# Patient Record
Sex: Male | Born: 1960 | Race: White | Hispanic: No | Smoking: Never smoker
Health system: Southern US, Community
[De-identification: ages and names within clinical notes are randomized; demographics above are authoritative.]

## PROBLEM LIST (undated history)

## (undated) DIAGNOSIS — E785 Hyperlipidemia, unspecified: Secondary | ICD-10-CM

## (undated) DIAGNOSIS — E119 Type 2 diabetes mellitus without complications: Secondary | ICD-10-CM

## (undated) DIAGNOSIS — N200 Calculus of kidney: Secondary | ICD-10-CM

## (undated) DIAGNOSIS — Z87442 Personal history of urinary calculi: Secondary | ICD-10-CM

## (undated) HISTORY — PX: RETINAL DETACHMENT SURGERY: SHX105

## (undated) HISTORY — DX: Type 2 diabetes mellitus without complications: E11.9

---

## 2007-03-10 ENCOUNTER — Ambulatory Visit: Payer: Self-pay | Admitting: Family Medicine

## 2014-06-20 ENCOUNTER — Encounter: Payer: Self-pay | Admitting: Emergency Medicine

## 2014-06-20 ENCOUNTER — Emergency Department
Admission: EM | Admit: 2014-06-20 | Discharge: 2014-06-20 | Disposition: A | Discharge status: Home / Self Care | Payer: BLUE CROSS/BLUE SHIELD | Attending: Emergency Medicine | Admitting: Emergency Medicine

## 2014-06-20 DIAGNOSIS — R319 Hematuria, unspecified: Secondary | ICD-10-CM

## 2014-06-20 DIAGNOSIS — R7989 Other specified abnormal findings of blood chemistry: Secondary | ICD-10-CM | POA: Diagnosis present

## 2014-06-20 LAB — URINALYSIS COMPLETE WITH MICROSCOPIC (ARMC ONLY)
Bilirubin Urine: NEGATIVE
Glucose, UA: NEGATIVE mg/dL
Ketones, ur: NEGATIVE mg/dL
Leukocytes, UA: NEGATIVE
Nitrite: NEGATIVE
PH: 6 (ref 5.0–8.0)
Protein, ur: 100 mg/dL — AB
SQUAMOUS EPITHELIAL / LPF: NONE SEEN
Specific Gravity, Urine: 1.025 (ref 1.005–1.030)

## 2014-06-20 LAB — CBC WITH DIFFERENTIAL/PLATELET
Basophils Absolute: 0.1 10*3/uL (ref 0–0.1)
Basophils Relative: 1 %
Eosinophils Absolute: 0.4 10*3/uL (ref 0–0.7)
Eosinophils Relative: 4 %
HCT: 44.2 % (ref 40.0–52.0)
Hemoglobin: 15.7 g/dL (ref 13.0–18.0)
LYMPHS ABS: 2.4 10*3/uL (ref 1.0–3.6)
Lymphocytes Relative: 26 %
MCH: 30.9 pg (ref 26.0–34.0)
MCHC: 35.5 g/dL (ref 32.0–36.0)
MCV: 86.9 fL (ref 80.0–100.0)
Monocytes Absolute: 0.6 10*3/uL (ref 0.2–1.0)
Monocytes Relative: 7 %
Neutro Abs: 5.6 10*3/uL (ref 1.4–6.5)
Neutrophils Relative %: 62 %
Platelets: 212 10*3/uL (ref 150–440)
RBC: 5.09 MIL/uL (ref 4.40–5.90)
RDW: 12.5 % (ref 11.5–14.5)
WBC: 9 10*3/uL (ref 3.8–10.6)

## 2014-06-20 LAB — COMPREHENSIVE METABOLIC PANEL
ALT: 52 U/L (ref 17–63)
ANION GAP: 9 (ref 5–15)
AST: 32 U/L (ref 15–41)
Albumin: 4.7 g/dL (ref 3.5–5.0)
Alkaline Phosphatase: 62 U/L (ref 38–126)
BILIRUBIN TOTAL: 0.6 mg/dL (ref 0.3–1.2)
BUN: 19 mg/dL (ref 6–20)
CHLORIDE: 102 mmol/L (ref 101–111)
CO2: 28 mmol/L (ref 22–32)
Calcium: 9.6 mg/dL (ref 8.9–10.3)
Creatinine, Ser: 1.01 mg/dL (ref 0.61–1.24)
GFR calc Af Amer: >60 mL/min (ref >60)
GFR calc non Af Amer: >60 mL/min (ref >60)
Glucose, Bld: 117 mg/dL — ABNORMAL HIGH (ref 65–99)
Potassium: 4.1 mmol/L (ref 3.5–5.1)
Sodium: 139 mmol/L (ref 135–145)
TOTAL PROTEIN: 8 g/dL (ref 6.5–8.1)

## 2014-06-20 NOTE — ED Provider Notes (Signed)
[facility] Emergency Department Provider Note  ____________________________________________  Time seen: 2050  I have reviewed the triage vital signs and the nursing notes.   HISTORY  Chief Complaint Abnormal Lab  dark urine, hematuria.    HPI Bradley Gomez is a 54 y.o. male who has had dark urine. He went to [facility] acute-care for evaluation. They were concerned due to the dark and brown color and he was escorted over to the emergency department for further evaluation. The patient has no pain, he is in no distress. He does report a degree of fullness in his suprapubic area. This discoloration of urine has been occurring for approximately one week. Does have history of kidney stones. This is nothing like his prior stones.    History reviewed. No pertinent past medical history.  There are no active problems to display for this patient.   No past surgical history on file.  No current outpatient prescriptions on file.  Allergies Review of patient's allergies indicates no known allergies.  No family history on file.  Social History History  Substance Use Topics  . Smoking status: Not on file  . Smokeless tobacco: Not on file  . Alcohol Use: Not on file    Review of Systems  Constitutional: Negative for fever. ENT: Negative for sore throat. Cardiovascular: Negative for chest pain. Respiratory: Negative for shortness of breath. Gastrointestinal: Negative for abdominal pain, vomiting and diarrhea. Genitourinary: Dark urine. See history of present illness Musculoskeletal: Negative for back pain. Skin: Negative for rash. Neurological: Negative for headaches   10-point ROS otherwise negative.  ____________________________________________   PHYSICAL EXAM:  VITAL SIGNS: ED Triage Vitals  Enc Vitals Group     BP 06/20/14 1902 141/91 mmHg     Pulse Rate 06/20/14 1902 72     Resp 06/20/14 1902 18     Temp 06/20/14 1902 97.5 F (36.4 C)      Temp Source 06/20/14 1902 Oral     SpO2 06/20/14 1902 100 %     Weight 06/20/14 1902 263 lb (119.296 kg)     Height 06/20/14 1902 6' (1.829 m)     Head Cir --      Peak Flow --      Pain Score 06/20/14 1903 1     Pain Loc --      Pain Edu? --      Excl. in GC? --     Constitutional:  Alert and oriented. Well appearing and in no distress. ENT   Head: Normocephalic and atraumatic.   Nose: No congestion/rhinnorhea.   Mouth/Throat: Mucous membranes are moist.      Ears: Cardiovascular: Normal rate, regular rhythm. Respiratory: Normal respiratory effort without tachypnea. Breath sounds are clear and equal bilaterally. No wheezes/rales/rhonchi. Gastrointestinal: Soft and nontender. No distention.  Back: No muscle spasm, no tenderness, no CVA tenderness. Musculoskeletal: Nontender with normal range of motion in all extremities.  No noted edema. Neurologic:  Normal speech and language. No gross focal neurologic deficits are appreciated.  Skin:  Skin is warm, dry. No rash noted. Psychiatric: Mood and affect are normal. Speech and behavior are normal.  ____________________________________________    LABS (pertinent positives/negatives)  Blood count is normal hemoglobin of 15.7 platelets of 212 Metabolic panel is normal with normal renal function and normal electrolytes. Urinalysis shows red blood cells too numerous to count and white blood cells 6-30. ____________________________________________   INITIAL IMPRESSION / ASSESSMENT AND PLAN / ED COURSE  Well-appearing patient with no pain,  asymptomatic hematuria. With normal blood tests and a urinalysis that does not show sign of infection we will discharge him to follow-up with urology for this hematuria. He does not appear to have any signs of urinary retention.  ____________________________________________   FINAL CLINICAL IMPRESSION(S) / ED DIAGNOSES  Final diagnoses:  Hematuria      Darien Ramusavid W Vestal Markin,  MD 06/20/14 2121

## 2014-06-20 NOTE — Discharge Instructions (Signed)

## 2014-06-20 NOTE — ED Notes (Signed)
States urine has been dark colored x 10 days

## 2014-06-28 ENCOUNTER — Other Ambulatory Visit: Payer: Self-pay | Admitting: Urology

## 2014-06-28 DIAGNOSIS — R310 Gross hematuria: Secondary | ICD-10-CM

## 2014-06-30 ENCOUNTER — Ambulatory Visit: Payer: BLUE CROSS/BLUE SHIELD | Admitting: Anesthesiology

## 2014-06-30 ENCOUNTER — Ambulatory Visit
Admission: AD | Admit: 2014-06-30 | Discharge: 2014-06-30 | Disposition: A | Discharge status: Home / Self Care | Payer: BLUE CROSS/BLUE SHIELD | Source: Ambulatory Visit | Attending: Urology | Admitting: Urology

## 2014-06-30 ENCOUNTER — Encounter: Payer: Self-pay | Admitting: *Deleted

## 2014-06-30 ENCOUNTER — Encounter
Admission: AD | Disposition: A | Discharge status: Home / Self Care | Payer: Self-pay | Source: Ambulatory Visit | Attending: Urology

## 2014-06-30 DIAGNOSIS — N201 Calculus of ureter: Secondary | ICD-10-CM | POA: Diagnosis present

## 2014-06-30 DIAGNOSIS — N23 Unspecified renal colic: Secondary | ICD-10-CM | POA: Diagnosis present

## 2014-06-30 DIAGNOSIS — Z8489 Family history of other specified conditions: Secondary | ICD-10-CM | POA: Insufficient documentation

## 2014-06-30 DIAGNOSIS — N132 Hydronephrosis with renal and ureteral calculous obstruction: Secondary | ICD-10-CM | POA: Diagnosis not present

## 2014-06-30 DIAGNOSIS — A419 Sepsis, unspecified organism: Secondary | ICD-10-CM | POA: Diagnosis not present

## 2014-06-30 DIAGNOSIS — Z801 Family history of malignant neoplasm of trachea, bronchus and lung: Secondary | ICD-10-CM | POA: Diagnosis not present

## 2014-06-30 DIAGNOSIS — Z833 Family history of diabetes mellitus: Secondary | ICD-10-CM | POA: Diagnosis not present

## 2014-06-30 HISTORY — PX: CYSTOSCOPY WITH STENT PLACEMENT: SHX5790

## 2014-06-30 SURGERY — CYSTOSCOPY, WITH STENT INSERTION
Anesthesia: General | Laterality: Left | Wound class: Clean Contaminated

## 2014-06-30 MED ORDER — FAMOTIDINE 20 MG PO TABS
20.0000 mg | ORAL_TABLET | Freq: Once | ORAL | Status: AC
  Administered 2014-06-30: 20 mg via ORAL

## 2014-06-30 MED ORDER — FENTANYL CITRATE (PF) 100 MCG/2ML IJ SOLN
25.0000 ug | INTRAMUSCULAR | Status: DC | PRN
  Administered 2014-06-30 (×4): 25 ug via INTRAVENOUS

## 2014-06-30 MED ORDER — LEVOFLOXACIN IN D5W 500 MG/100ML IV SOLN
500.0000 mg | Freq: Once | INTRAVENOUS | Status: AC
  Administered 2014-06-30: 500 mg via INTRAVENOUS

## 2014-06-30 MED ORDER — LABETALOL HCL 5 MG/ML IV SOLN
5.0000 mg | INTRAVENOUS | Status: AC | PRN
  Administered 2014-06-30 (×3): 5 mg via INTRAVENOUS

## 2014-06-30 MED ORDER — LEVOFLOXACIN 500 MG PO TABS: 500.0000 mg | ORAL_TABLET | Freq: Every day | ORAL | Status: DC

## 2014-06-30 MED ORDER — MIDAZOLAM HCL 2 MG/2ML IJ SOLN
INTRAMUSCULAR | Status: DC | PRN
  Administered 2014-06-30: 2 mg via INTRAVENOUS

## 2014-06-30 MED ORDER — LACTATED RINGERS IV SOLN
INTRAVENOUS | Status: DC | PRN
  Administered 2014-06-30: 16:00:00 via INTRAVENOUS

## 2014-06-30 MED ORDER — LEVOFLOXACIN IN D5W 500 MG/100ML IV SOLN
INTRAVENOUS | Status: DC
Start: 2014-06-30 — End: 2014-06-30
  Filled 2014-06-30: qty 100

## 2014-06-30 MED ORDER — LIDOCAINE HCL (CARDIAC) 20 MG/ML IV SOLN
INTRAVENOUS | Status: DC | PRN
  Administered 2014-06-30: 50 mg via INTRAVENOUS

## 2014-06-30 MED ORDER — LIDOCAINE HCL 2 % IJ SOLN: 0.1000 mL | Freq: Once | INTRAMUSCULAR | Status: DC

## 2014-06-30 MED ORDER — FENTANYL CITRATE (PF) 100 MCG/2ML IJ SOLN
INTRAMUSCULAR | Status: AC
  Administered 2014-06-30: 25 ug via INTRAVENOUS
  Filled 2014-06-30: qty 2

## 2014-06-30 MED ORDER — HYDROMORPHONE HCL 1 MG/ML IJ SOLN: 0.1000 mg | INTRAMUSCULAR | Status: DC | PRN

## 2014-06-30 MED ORDER — URIBEL 118 MG PO CAPS: 1.0000 | ORAL_CAPSULE | Freq: Four times a day (QID) | ORAL | Status: DC | PRN

## 2014-06-30 MED ORDER — FENTANYL CITRATE (PF) 100 MCG/2ML IJ SOLN
INTRAMUSCULAR | Status: DC | PRN
  Administered 2014-06-30 (×3): 50 ug via INTRAVENOUS

## 2014-06-30 MED ORDER — PROPOFOL 10 MG/ML IV BOLUS
INTRAVENOUS | Status: DC | PRN
  Administered 2014-06-30: 200 mg via INTRAVENOUS

## 2014-06-30 MED ORDER — LABETALOL HCL 5 MG/ML IV SOLN
INTRAVENOUS | Status: AC
  Administered 2014-06-30: 5 mg via INTRAVENOUS
  Filled 2014-06-30: qty 4

## 2014-06-30 MED ORDER — PROMETHAZINE HCL 25 MG RE SUPP: 25.0000 mg | Freq: Four times a day (QID) | RECTAL | Status: DC | PRN

## 2014-06-30 MED ORDER — FAMOTIDINE 20 MG PO TABS
ORAL_TABLET | ORAL | Status: AC
  Administered 2014-06-30: 20 mg via ORAL
  Filled 2014-06-30: qty 1

## 2014-06-30 MED ORDER — ONDANSETRON 8 MG PO TBDP: 8.0000 mg | ORAL_TABLET | Freq: Four times a day (QID) | ORAL | Status: DC | PRN

## 2014-06-30 MED ORDER — BELLADONNA ALKALOIDS-OPIUM 16.2-30 MG RE SUPP
RECTAL | Status: DC | PRN
  Administered 2014-06-30: 15 mg via RECTAL

## 2014-06-30 MED ORDER — ONDANSETRON HCL 4 MG/2ML IJ SOLN: 4.0000 mg | Freq: Once | INTRAMUSCULAR | Status: DC | PRN

## 2014-06-30 MED ORDER — DEXTROSE-NACL 5-0.45 % IV SOLN
INTRAVENOUS | Status: DC
  Administered 2014-06-30: 16:00:00 via INTRAVENOUS

## 2014-06-30 MED ORDER — LIDOCAINE HCL 2 % EX GEL
CUTANEOUS | Status: DC | PRN
  Administered 2014-06-30: 1

## 2014-06-30 MED ORDER — LIDOCAINE HCL 2 % EX GEL
CUTANEOUS | Status: AC
  Filled 2014-06-30: qty 10

## 2014-06-30 MED ORDER — ONDANSETRON 4 MG PO TBDP: 8.0000 mg | ORAL_TABLET | Freq: Four times a day (QID) | ORAL | Status: DC | PRN

## 2014-06-30 MED ORDER — DOCUSATE SODIUM 100 MG PO CAPS: 200.0000 mg | ORAL_CAPSULE | Freq: Two times a day (BID) | ORAL | Status: DC

## 2014-06-30 MED ORDER — ONDANSETRON HCL 4 MG/2ML IJ SOLN
INTRAMUSCULAR | Status: DC | PRN
  Administered 2014-06-30: 4 mg via INTRAVENOUS

## 2014-06-30 MED ORDER — NUCYNTA 50 MG PO TABS: 50.0000 mg | ORAL_TABLET | Freq: Four times a day (QID) | ORAL | Status: DC | PRN

## 2014-06-30 MED ORDER — BELLADONNA ALKALOIDS-OPIUM 16.2-60 MG RE SUPP
RECTAL | Status: AC
  Filled 2014-06-30: qty 1

## 2014-06-30 SURGICAL SUPPLY — 22 items
BAG DRAIN CYSTO-URO LG1000N (MISCELLANEOUS) ×2 IMPLANT
CONRAY 43 FOR UROLOGY 50M (MISCELLANEOUS) ×2 IMPLANT
GLOVE BIO SURGEON STRL SZ7 (GLOVE) ×4 IMPLANT
GLOVE BIO SURGEON STRL SZ7.5 (GLOVE) ×2 IMPLANT
GOWN STRL REUS W/ TWL LRG LVL3 (GOWN DISPOSABLE) ×1 IMPLANT
GOWN STRL REUS W/ TWL XL LVL3 (GOWN DISPOSABLE) ×1 IMPLANT
GOWN STRL REUS W/TWL LRG LVL3 (GOWN DISPOSABLE) ×1
GOWN STRL REUS W/TWL XL LVL3 (GOWN DISPOSABLE) ×1
GUIDEWIRE STR ZIPWIRE 035X150 (MISCELLANEOUS) ×2 IMPLANT
KIT RM TURNOVER CYSTO AR (KITS) ×2 IMPLANT
OPEN-END URETERAL CATHETER ×2 IMPLANT
PACK CYSTO AR (MISCELLANEOUS) ×2 IMPLANT
PREP PVP WINGED SPONGE (MISCELLANEOUS) ×2 IMPLANT
SET CYSTO W/LG BORE CLAMP LF (SET/KITS/TRAYS/PACK) ×2 IMPLANT
SOL .9 NS 3000ML IRR  AL (IV SOLUTION) ×1
SOL .9 NS 3000ML IRR UROMATIC (IV SOLUTION) ×1 IMPLANT
SOL PREP PVP 2OZ (MISCELLANEOUS) ×2
SOLUTION PREP PVP 2OZ (MISCELLANEOUS) ×1 IMPLANT
STENT URET 6FRX24 CONTOUR (STENTS) IMPLANT
STENT URET 6FRX26 CONTOUR (STENTS) ×2 IMPLANT
STENT URET 6FRX26 FIRM (Stent) ×4 IMPLANT
WATER STERILE IRR 1000ML POUR (IV SOLUTION) ×2 IMPLANT

## 2014-06-30 NOTE — Op Note (Signed)
Op Note  Preoperative diagnosis: Left ureterolithiasis. Left hydronephrosis with urinary sepsis Postoperative diagnosis: Same  Procedure: 1. Cystoscopy with left double pigtail stent placement 2. Left retrograde pyelogram   Surgeon: Suszanne ConnersMichael R. Evelene CroonWolff MD, FACS Anesthesia: Gen.  Indications:See the history and physical. After informed consent the above procedure(s) were requested     Technique and findings: After adequate general anesthesia had been obtained the patient was placed into dorsal lithotomy position. The perineum was prepped and draped in the usual fashion. Fluoroscopy confirmed the presence of a proximal left ureteral calculus measuring 10 x 5 mm. At this point an 8 [place] open ended ureteral catheter was advanced through the scope and placed into the left ureteral orifice. Retrograde pyelogram was performed. Fluoroscopic and static images confirmed the presence of a proximal left ureteral calculus with proximal hydronephrosis. No other abnormalities or filling defects were identified. At this point a 0.035 Glidewire was advanced into the left ureteral orifice and up the ureter beyond the stone. The guidewire was then curled into the renal pelvis. A 6 x 26 cm double pigtail stent with suture attached distally was advanced over the guidewire. The guidewire was then removed taking care to leave the stent in position. Placement of the stent was confirmed with for fluoroscopy. The stone had been pushed back into the renal pelvis. At this point the bladder was drained and the cystoscope was removed. 10 cc of viscous Xylocaine was instilled within the urethra and the bladder. A B and O suppository was placed. The procedure was then terminated and the patient was transferred to the recovery room in stable condition.

## 2014-06-30 NOTE — Anesthesia Procedure Notes (Signed)
Procedure Name: LMA Insertion Date/Time: 06/30/2014 4:43 PM Performed by: Tonia GhentOOK-MARTIN, Bobbijo Holst Pre-anesthesia Checklist: Patient identified, Emergency Drugs available, Suction available, Patient being monitored and Timeout performed Patient Re-evaluated:Patient Re-evaluated prior to inductionOxygen Delivery Method: Circle system utilized and Simple face mask Preoxygenation: Pre-oxygenation with 100% oxygen Intubation Type: IV induction Ventilation: Mask ventilation without difficulty LMA Size: 4.0 Number of attempts: 1 Placement Confirmation: positive ETCO2 and breath sounds checked- equal and bilateral Tube secured with: Tape

## 2014-06-30 NOTE — Discharge Instructions (Signed)
Ureteral Stent Implantation Ureteral stent implantation is the implantation of a soft plastic tube with multiple holes into the tube that drains urine from your kidney to your bladder (ureter). The stent helps drain your kidney when there is a blockage of the flow of urine in your ureter. The stent has a coil on each end to keep it from falling out. One end stays in the kidney. The other end stays in the bladder. It is most often taken out after any blockage has been removed or your ureter has healed. Short-term stents have a string attached to make removal quite easy. Removal of a short-term stent can be done in your health care provider's office or by you at home. Long-term stents need to be changed every few months. LET [facility]OUR [facility]ALTH CARE PROVIDER KNOW ABOUT: Any allergies you have. All medicines you are taking, including vitamins, herbs, eye drops, creams, and over-the-counter medicines. Previous problems you or members of your family have had with the use of anesthetics. Any blood disorders you have. Previous surgeries you have had. Medical conditions you have. RISKS AND COMPLICATIONS Generally, ureteral stent implantation is a safe procedure. However, as with any procedure, complications can occur. Possible complications include: Movement of the stent away from where it was originally placed (migration). This may affect the ability of the stent to properly drain your kidney. If migration of the stent occurs, the stent may need to be replaced or repositioned. Perforation of the ureter. Infection. BEFORE THE PROCEDURE You may be asked to wash your genital area with sterile soap the morning of your procedure. You may be given an oral antibiotic which you should take with a sip of water as prescribed by your health care provider. You may be asked to not eat or drink for 8 hours before the surgery. PROCEDURE First you will be given an anesthetic so you do not feel pain during the procedure. Your  health care provider will insert a special lighted instrument called a cystoscope into your bladder. This allows your health care provider to see the opening to your ureter. A thin wire is carefully threaded into your bladder and up the ureter. The stent is inserted over the wire and the wire is then removed. Your bladder will be emptied of urine. AFTER THE PROCEDURE You will be taken to a recovery room until it is okay for you to go home. Document Released: 01/12/2000 Document Revised: 01/19/2013 Document Reviewed: 06/23/2012 [facility] Patient Information 2015 [place], [place]. This information is not intended to replace advice given to you by your health care provider. Make sure you discuss any questions you have with your health care provider. Ureteral Colic (Kidney Stones) Ureteral colic is the result of a condition when kidney stones form inside the kidney. Once kidney stones are formed they may move into the tube that connects the kidney with the bladder (ureter). If this occurs, this condition may cause pain (colic) in the ureter.  CAUSES  Pain is caused by stone movement in the ureter and the obstruction caused by the stone. SYMPTOMS  The pain comes and goes as the ureter contracts around the stone. The pain is usually intense, sharp, and stabbing in character. The location of the pain may move as the stone moves through the ureter. When the stone is near the kidney the pain is usually located in the back and radiates to the belly (abdomen). When the stone is ready to pass into the bladder the pain is often located in the  lower abdomen on the side the stone is located. At this location, the symptoms may mimic those of a urinary tract infection with urinary frequency. Once the stone is located here it often passes into the bladder and the pain disappears completely. TREATMENT   Your caregiver will provide you with medicine for pain relief.  You may require specialized follow-up X-rays.  The  absence of pain does not always mean that the stone has passed. It may have just stopped moving. If the urine remains completely obstructed, it can cause loss of kidney function or even complete destruction of the involved kidney. It is your responsibility and in your interest that X-rays and follow-ups as suggested by your caregiver are completed. Relief of pain without passage of the stone can be associated with severe damage to the kidney, including loss of kidney function on that side.  If your stone does not pass on its own, additional measures may be taken by your caregiver to ensure its removal. HOME CARE INSTRUCTIONS   Increase your fluid intake. Water is the preferred fluid since juices containing vitamin C may acidify the urine making it less likely for certain stones (uric acid stones) to pass.  Strain all urine. A strainer will be provided. Keep all particulate matter or stones for your caregiver to inspect.  Take your pain medicine as directed.  Make a follow-up appointment with your caregiver as directed.  Remember that the goal is passage of your stone. The absence of pain does not mean the stone is gone. Follow your caregiver's instructions.  Only take over-the-counter or prescription medicines for pain, discomfort, or fever as directed by your caregiver. SEEK MEDICAL CARE IF:   Pain cannot be controlled with the prescribed medicine.  You have a fever.  Pain continues for longer than your caregiver advises it should.  There is a change in the pain, and you develop chest discomfort or constant abdominal pain.  You feel faint or pass out. MAKE SURE YOU:   Understand these instructions.  Will watch your condition.  Will get help right away if you are not doing well or get worse. Document Released: 10/24/2004 Document Revised: 05/11/2012 Document Reviewed: 07/11/2010 [facility] Patient Information 2015 [place], [place]. This information is not intended to replace advice  given to you by your health care provider. Make sure you discuss any questions you have with your health care provider.    AMBULATORY SURGERY  DISCHARGE INSTRUCTIONS   1) The drugs that you were given will stay in your system until tomorrow so for the next 24 hours you should not:  A) Drive an automobile B) Make any legal decisions C) Drink any alcoholic beverage   2) You may resume regular meals tomorrow.  Today it is better to start with liquids and gradually work up to solid foods.  You may eat anything you prefer, but it is better to start with liquids, then soup and crackers, and gradually work up to solid foods.   3) Please notify your doctor immediately if you have any unusual bleeding, trouble breathing, redness and pain at the surgery site, drainage, fever, or pain not relieved by medications                             4)

## 2014-06-30 NOTE — Anesthesia Postprocedure Evaluation (Signed)
  Anesthesia Post-op Note  Patient: Bradley GaviaDarrell D Gomez  Procedure(s) Performed: Procedure(s): CYSTOSCOPY WITH STENT PLACEMENT (Left)  Anesthesia type:General LMA  Patient location: PACU  Post pain: Pain level controlled  Post assessment: Post-op Vital signs reviewed, Patient's Cardiovascular Status Stable, Respiratory Function Stable, Patent Airway and No signs of Nausea or vomiting  Post vital signs: Reviewed and stable  Last Vitals:  Filed Vitals:   06/30/14 1827  BP: 126/82  Pulse: 63  Temp: 36.6 C  Resp: 16    Level of consciousness: awake, alert  and patient cooperative  Complications: No apparent anesthesia complications

## 2014-06-30 NOTE — H&P (Signed)
  Date of Initial H&P: 06/30/2010   History reviewed, patient examined, no change in status, stable for surgery.

## 2014-06-30 NOTE — Transfer of Care (Signed)
Immediate Anesthesia Transfer of Care Note  Patient: Eugene GaviaDarrell D Foulks  Procedure(s) Performed: Procedure(s): CYSTOSCOPY WITH STENT PLACEMENT (Left)  Patient Location: PACU  Anesthesia Type:General  Level of Consciousness: awake and sedated  Airway & Oxygen Therapy: Patient Spontanous Breathing and Patient connected to face mask oxygen  Post-op Assessment: Report given to RN and Post -op Vital signs reviewed and stable  Post vital signs: Reviewed and stable  Last Vitals:  Filed Vitals:   06/30/14 1538  BP: 136/91  Pulse: 60  Temp: 36.8 C  Resp: 18    Complications: No apparent anesthesia complications

## 2014-06-30 NOTE — Anesthesia Preprocedure Evaluation (Signed)
Anesthesia Evaluation  Patient identified by MRN, date of birth, ID band Patient awake    Reviewed: Allergy & Precautions, H&P , NPO status , Patient's Chart, lab work & pertinent test results, reviewed documented beta blocker date and time   Airway Mallampati: II  TM Distance: >3 FB Neck ROM: full    Dental   Pulmonary Current Smoker,          Cardiovascular Rate:Normal     Neuro/Psych    GI/Hepatic   Endo/Other    Renal/GU      Musculoskeletal   Abdominal   Peds  Hematology   Anesthesia Other Findings   Reproductive/Obstetrics                             Anesthesia Physical Anesthesia Plan  ASA: II  Anesthesia Plan: General LMA   Post-op Pain Management:    Induction:   Airway Management Planned:   Additional Equipment:   Intra-op Plan:   Post-operative Plan:   Informed Consent: I have reviewed the patients History and Physical, chart, labs and discussed the procedure including the risks, benefits and alternatives for the proposed anesthesia with the patient or authorized representative who has indicated his/her understanding and acceptance.     Plan Discussed with: CRNA  Anesthesia Plan Comments:         Anesthesia Quick Evaluation  

## 2014-07-01 ENCOUNTER — Encounter: Payer: Self-pay | Admitting: Urology

## 2014-07-04 ENCOUNTER — Encounter: Payer: Self-pay | Admitting: Urology

## 2014-07-06 ENCOUNTER — Encounter
Admission: RE | Admit: 2014-07-06 | Discharge: 2014-07-06 | Disposition: A | Discharge status: Home / Self Care | Payer: BLUE CROSS/BLUE SHIELD | Source: Ambulatory Visit | Attending: Urology | Admitting: Urology

## 2014-07-07 ENCOUNTER — Encounter: Payer: Self-pay | Admitting: *Deleted

## 2014-07-07 ENCOUNTER — Ambulatory Visit
Admission: RE | Admit: 2014-07-07 | Discharge: 2014-07-07 | Disposition: A | Discharge status: Home / Self Care | Payer: BLUE CROSS/BLUE SHIELD | Source: Ambulatory Visit | Attending: Urology | Admitting: Urology

## 2014-07-07 ENCOUNTER — Encounter
Admission: RE | Disposition: A | Discharge status: Home / Self Care | Payer: Self-pay | Source: Ambulatory Visit | Attending: Urology

## 2014-07-07 DIAGNOSIS — Z833 Family history of diabetes mellitus: Secondary | ICD-10-CM | POA: Diagnosis not present

## 2014-07-07 DIAGNOSIS — N201 Calculus of ureter: Secondary | ICD-10-CM | POA: Diagnosis not present

## 2014-07-07 DIAGNOSIS — Z8489 Family history of other specified conditions: Secondary | ICD-10-CM | POA: Insufficient documentation

## 2014-07-07 DIAGNOSIS — Z79899 Other long term (current) drug therapy: Secondary | ICD-10-CM | POA: Insufficient documentation

## 2014-07-07 HISTORY — PX: EXTRACORPOREAL SHOCK WAVE LITHOTRIPSY: SHX1557

## 2014-07-07 SURGERY — LITHOTRIPSY, ESWL
Anesthesia: Moderate Sedation | Laterality: Left

## 2014-07-07 MED ORDER — ONDANSETRON 8 MG PO TBDP: 8.0000 mg | ORAL_TABLET | Freq: Four times a day (QID) | ORAL | Status: DC | PRN

## 2014-07-07 MED ORDER — LEVOFLOXACIN 500 MG PO TABS: 500.0000 mg | ORAL_TABLET | Freq: Every day | ORAL | Status: DC

## 2014-07-07 MED ORDER — TAMSULOSIN HCL 0.4 MG PO CAPS: 0.4000 mg | ORAL_CAPSULE | Freq: Every day | ORAL | Status: DC

## 2014-07-07 MED ORDER — PROMETHAZINE HCL 25 MG/ML IJ SOLN
25.0000 mg | Freq: Once | INTRAMUSCULAR | Status: AC
Start: 2014-07-07 — End: 2014-07-07
  Administered 2014-07-07: 25 mg via INTRAMUSCULAR

## 2014-07-07 MED ORDER — DEXTROSE-NACL 5-0.45 % IV SOLN
INTRAVENOUS | Status: DC
  Administered 2014-07-07: 13:00:00 via INTRAVENOUS

## 2014-07-07 MED ORDER — MIDAZOLAM HCL 2 MG/2ML IJ SOLN
1.0000 mg | Freq: Once | INTRAMUSCULAR | Status: AC
  Administered 2014-07-07: 1 mg via INTRAMUSCULAR

## 2014-07-07 MED ORDER — PROMETHAZINE HCL 25 MG/ML IJ SOLN
INTRAMUSCULAR | Status: AC
  Administered 2014-07-07: 25 mg via INTRAMUSCULAR
  Filled 2014-07-07: qty 1

## 2014-07-07 MED ORDER — MORPHINE SULFATE 10 MG/ML IJ SOLN
10.0000 mg | Freq: Once | INTRAMUSCULAR | Status: AC
Start: 2014-07-07 — End: 2014-07-07
  Administered 2014-07-07: 10 mg via INTRAMUSCULAR

## 2014-07-07 MED ORDER — MIDAZOLAM HCL 2 MG/2ML IJ SOLN
INTRAMUSCULAR | Status: AC
  Administered 2014-07-07: 1 mg via INTRAMUSCULAR
  Filled 2014-07-07: qty 2

## 2014-07-07 MED ORDER — DIPHENHYDRAMINE HCL 25 MG PO CAPS
25.0000 mg | ORAL_CAPSULE | ORAL | Status: AC
  Administered 2014-07-07: 25 mg via ORAL

## 2014-07-07 MED ORDER — DOCUSATE SODIUM 100 MG PO CAPS: 200.0000 mg | ORAL_CAPSULE | Freq: Two times a day (BID) | ORAL | Status: DC

## 2014-07-07 MED ORDER — MORPHINE SULFATE 10 MG/ML IJ SOLN
INTRAMUSCULAR | Status: AC
  Administered 2014-07-07: 10 mg via INTRAMUSCULAR
  Filled 2014-07-07: qty 1

## 2014-07-07 MED ORDER — DIPHENHYDRAMINE HCL 25 MG PO CAPS
ORAL_CAPSULE | ORAL | Status: AC
  Administered 2014-07-07: 25 mg via ORAL
  Filled 2014-07-07: qty 1

## 2014-07-07 MED ORDER — NUCYNTA 50 MG PO TABS: 50.0000 mg | ORAL_TABLET | Freq: Four times a day (QID) | ORAL | Status: DC | PRN

## 2014-07-07 MED ORDER — URIBEL 118 MG PO CAPS: 1.0000 | ORAL_CAPSULE | Freq: Four times a day (QID) | ORAL | Status: DC | PRN

## 2014-07-07 MED ORDER — LEVOFLOXACIN 500 MG PO TABS: 500.0000 mg | ORAL_TABLET | Freq: Once | ORAL | Status: DC

## 2014-07-07 NOTE — Discharge Instructions (Signed)
Lithotripsy for Kidney Stones °Lithotripsy is a treatment that can sometimes help eliminate kidney stones and pain that they cause. A form of lithotripsy, also known as extracorporeal shock wave lithotripsy, is a nonsurgical procedure that helps your body rid itself of the kidney stone when it is too big to pass on its own. Extracorporeal shock wave lithotripsy is a method of crushing a kidney stone with shock waves. These shock waves pass through your body and are focused on your stone. They cause the kidney stones to crumble while still in the urinary tract. It is then easier for the smaller pieces of stone to pass in the urine. °Lithotripsy usually takes about an hour. It is done in a hospital, a lithotripsy center, or a mobile unit. It usually does not require an overnight stay. Your health care provider will instruct you on preparation for the procedure. Your health care provider will tell you what to expect afterward. °LET YOUR HEALTH CARE PROVIDER KNOW ABOUT: °· Any allergies you have. °· All medicines you are taking, including vitamins, herbs, eye drops, creams, and over-the-counter medicines. °· Previous problems you or members of your family have had with the use of anesthetics. °· Any blood disorders you have. °· Previous surgeries you have had. °· Medical conditions you have. °RISKS AND COMPLICATIONS °Generally, lithotripsy for kidney stones is a safe procedure. However, as with any procedure, complications can occur. Possible complications include: °· Infection. °· Bleeding of the kidney. °· Bruising of the kidney or skin. °· Obstruction of the ureter. °· Failure of the stone to fragment. °BEFORE THE PROCEDURE °· Do not eat or drink for 6-8 hours prior to the procedure. You may, however, take the medications with a sip of water that your physician instructs you to take °· Do not take aspirin or aspirin-containing products for 7 days prior to your procedure °· Do not take nonsteroidal anti-inflammatory  products for 7 days prior to your procedure °PROCEDURE °A stent (flexible tube with holes) may be placed in your ureter. The ureter is the tube that transports the urine from the kidneys to the bladder. Your health care provider may place a stent before the procedure. This will help keep urine flowing from the kidney if the fragments of the stone block the ureter. You may have an IV tube placed in one of your veins to give you fluids and medicines. These medicines may help you relax or make you sleep. During the procedure, you will lie comfortably on a fluid-filled cushion or in a warm-water bath. After an X-ray or ultrasound exam to locate your stone, shock waves are aimed at the stone. If you are awake, you may feel a tapping sensation as the shock waves pass through your body. If large stone particles remain after treatment, a second procedure may be necessary at a later date. °For comfort during the test: °· Relax as much as possible. °· Try to remain still as much as possible. °· Try to follow instructions to speed up the test. °· Let your health care provider know if you are uncomfortable, anxious, or in pain. °AFTER THE PROCEDURE  °After surgery, you will be taken to the recovery area. A nurse will watch and check your progress. Once you're awake, stable, and taking fluids well, you will be allowed to go home as long as there are no problems. You will also be allowed to pass your urine before discharge. You may be given antibiotics to help prevent infection. You may also be prescribed   pain medicine if needed. In a week or two, your health care provider may remove your stent, if you have one. You may first have an X-ray exam to check on how successful the fragmentation of your stone has been and how much of the stone has passed. Your health care provider will check to see whether or not stone particles remain. °SEEK IMMEDIATE MEDICAL CARE IF: °· You develop a fever or shaking chills. °· Your pain is not  relieved by medicine. °· You feel sick to your stomach (nauseated) and you vomit. °· You develop heavy bleeding. °· You have difficulty urinating. °· You start to pass your stent from your penis. °Document Released: 01/12/2000 Document Revised: 11/04/2012 Document Reviewed: 07/30/2012 °ExitCare® Patient Information ©2015 [employer], [employer]. This information is not intended to replace advice given to you by your health care provider. Make sure you discuss any questions you have with your health care provider. ° °

## 2014-07-07 NOTE — H&P (Signed)
Date of Initial H&P: 06/29/14  History reviewed, patient examined, no change in status, stable for surgery. 

## 2014-07-08 ENCOUNTER — Ambulatory Visit: Payer: BLUE CROSS/BLUE SHIELD

## 2014-10-07 ENCOUNTER — Encounter: Payer: Self-pay | Admitting: Urology

## 2016-06-25 ENCOUNTER — Encounter: Payer: Self-pay | Admitting: *Deleted

## 2016-06-25 ENCOUNTER — Encounter: Payer: BLUE CROSS/BLUE SHIELD | Attending: Family Medicine | Admitting: *Deleted

## 2016-06-25 VITALS — BP 136/84 | Ht 72.0 in | Wt 245.9 lb

## 2016-06-25 DIAGNOSIS — E119 Type 2 diabetes mellitus without complications: Secondary | ICD-10-CM

## 2016-06-25 DIAGNOSIS — Z6833 Body mass index (BMI) 33.0-33.9, adult: Secondary | ICD-10-CM | POA: Insufficient documentation

## 2016-06-25 DIAGNOSIS — Z713 Dietary counseling and surveillance: Secondary | ICD-10-CM | POA: Diagnosis not present

## 2016-06-25 NOTE — Patient Instructions (Signed)
Check blood sugars 2 x day before breakfast and 2 hrs after supper every day  Bring blood sugar records to the next class  Exercise: Begin riding bike  for 10  minutes 3  days a week and gradually increase to 30 minutes 5 x week  Eat 3 meals day,  1-2 snacks a day Space meals 4-6 hours apart  Avoid sugar sweetened drinks (sports drinks)  Make an eye doctor appointment  Return for classes on:

## 2016-06-25 NOTE — Progress Notes (Signed)
Diabetes Self-Management Education  Visit Type: First/Initial  Appt. Start Time: 1545 Appt. End Time: 1700  06/25/2016  Bradley Gomez, identified by name and date of birth, is a 56 y.o. male with a diagnosis of Diabetes: Type 2.   ASSESSMENT  Blood pressure 136/84, height 6' (1.829 m), weight 245 lb 14.4 oz (111.5 kg). Body mass index is 33.35 kg/m.      Diabetes Self-Management Education - 06/25/16 1718      Visit Information   Visit Type First/Initial     Initial Visit   Diabetes Type Type 2   Are you currently following a meal plan? Yes   What type of meal plan do you follow? "eating food with less sugar"   Are you taking your medications as prescribed? Yes   Date Diagnosed 1 month ago     Health Coping   How would you rate your overall health? Fair     Psychosocial Assessment   Patient Belief/Attitude about Diabetes Motivated to manage diabetes  "wished I didn't have it"   Self-care barriers None   Self-management support Doctor's office;Friends   Other persons present Other (comment)  fiancee   Patient Concerns Nutrition/Meal planning;Glycemic Control;Monitoring;Weight Control;Healthy Lifestyle   Special Needs None   Preferred Learning Style Auditory;Hands on   Learning Readiness Change in progress   How often do you need to have someone help you when you read instructions, pamphlets, or other written materials from your doctor or pharmacy? 1 - Never   What is the last grade level you completed in school? 12     Pre-Education Assessment   Patient understands the diabetes disease and treatment process. Needs Instruction   Patient understands incorporating nutritional management into lifestyle. Needs Instruction   Patient undertands incorporating physical activity into lifestyle. Needs Instruction   Patient understands using medications safely. Needs Instruction   Patient understands monitoring blood glucose, interpreting and using results Needs Review    Patient understands prevention, detection, and treatment of acute complications. Needs Instruction   Patient understands prevention, detection, and treatment of chronic complications. Needs Instruction   Patient understands how to develop strategies to address psychosocial issues. Needs Instruction   Patient understands how to develop strategies to promote health/change behavior. Needs Instruction     Complications   Last HgB A1C per patient/outside source 11 %  05/09/16   How often do you check your blood sugar? 1-2 times/day   Postprandial Blood glucose range (mg/dL) 132-440;102-725130-179;180-200  Pt reports pp readings of 150-180's mg/dL.    Have you had a dilated eye exam in the past 12 months? No   Have you had a dental exam in the past 12 months? No   Are you checking your feet? Yes   How many days per week are you checking your feet? 7     Dietary Intake   Breakfast peanut butter crackers, apple, cheese its   Lunch 1/2 sandwich with peanut butter, hame, chicken salad or pimento cheese and fruit   Dinner meat - chicken, beef, pork, fish with pinto beans, green beans, peas, corn, salad - occasional spaghetti, pizza, fries   Beverage(s) water, G2     Exercise   Exercise Type ADL's     Patient Education   Previous Diabetes Education No   Disease state  Definition of diabetes, type 1 and 2, and the diagnosis of diabetes   Nutrition management  Role of diet in the treatment of diabetes and the relationship between the three main  macronutrients and blood glucose level;Carbohydrate counting;Reviewed blood glucose goals for pre and post meals and how to evaluate the patients' food intake on their blood glucose level.   Physical activity and exercise  Role of exercise on diabetes management, blood pressure control and cardiac health.   Medications Reviewed patients medication for diabetes, action, purpose, timing of dose and side effects.;Other (comment)  Cortisone injections and elevated blood sugars     Monitoring Purpose and frequency of SMBG.;Taught/discussed recording of test results and interpretation of SMBG.;Identified appropriate SMBG and/or A1C goals.   Chronic complications Relationship between chronic complications and blood glucose control;Retinopathy and reason for yearly dilated eye exams   Psychosocial adjustment Identified and addressed patients feelings and concerns about diabetes     Individualized Goals (developed by patient)   Reducing Risk Improve blood sugars Prevent diabetes complications Lose weight Lead a healthier lifestyle Become more fit     Outcomes   Expected Outcomes Demonstrated interest in learning. Expect positive outcomes      Individualized Plan for Diabetes Self-Management Training:   Learning Objective:  Patient will have a greater understanding of diabetes self-management. Patient education plan is to attend individual and/or group sessions per assessed needs and concerns.   Plan:   Patient Instructions  Check blood sugars 2 x day before breakfast and 2 hrs after supper every day Bring blood sugar records to the next class Exercise: Begin riding bike  for 10  minutes 3  days a week and gradually increase to 30 minutes 5 x week Eat 3 meals day,  1-2 snacks a day Space meals 4-6 hours apart Avoid sugar sweetened drinks (sports drinks) Make an eye doctor appointment   Expected Outcomes:  Demonstrated interest in learning. Expect positive outcomes  Education material provided:  General Meal Planning Guidelines Simple Meal Plan  If problems or questions, patient to contact team via:  Sharion Settler, RN, CCM, CDE [phone]  Future DSME appointment:  Pt to check his fiancee's work calendar and call back to schedule classes.

## 2016-07-02 ENCOUNTER — Telehealth: Payer: Self-pay | Admitting: *Deleted

## 2016-07-02 NOTE — Telephone Encounter (Signed)
Received voice mail from patient's fiancee that he wants to come to class beginning June 25th but doesn't know where classes are held. Called her back and [facility]. She also reports that he was started on Glipizide and his blood sugars are better - 127 last night after supper and 79 this morning fasting.

## 2016-07-22 ENCOUNTER — Encounter: Payer: BLUE CROSS/BLUE SHIELD | Attending: Family Medicine | Admitting: Dietician

## 2016-07-22 ENCOUNTER — Encounter: Payer: Self-pay | Admitting: Dietician

## 2016-07-22 VITALS — Ht 72.0 in | Wt 243.1 lb

## 2016-07-22 DIAGNOSIS — E119 Type 2 diabetes mellitus without complications: Secondary | ICD-10-CM

## 2016-07-22 DIAGNOSIS — Z713 Dietary counseling and surveillance: Secondary | ICD-10-CM | POA: Insufficient documentation

## 2016-07-22 DIAGNOSIS — Z6833 Body mass index (BMI) 33.0-33.9, adult: Secondary | ICD-10-CM | POA: Insufficient documentation

## 2016-07-22 NOTE — Progress Notes (Signed)

## 2016-08-05 ENCOUNTER — Encounter: Payer: Self-pay | Admitting: Dietician

## 2016-08-05 ENCOUNTER — Encounter: Payer: BLUE CROSS/BLUE SHIELD | Attending: Family Medicine | Admitting: Dietician

## 2016-08-05 VITALS — Wt 243.3 lb

## 2016-08-05 DIAGNOSIS — Z6833 Body mass index (BMI) 33.0-33.9, adult: Secondary | ICD-10-CM | POA: Diagnosis not present

## 2016-08-05 DIAGNOSIS — Z713 Dietary counseling and surveillance: Secondary | ICD-10-CM | POA: Diagnosis not present

## 2016-08-05 DIAGNOSIS — E119 Type 2 diabetes mellitus without complications: Secondary | ICD-10-CM

## 2016-08-05 NOTE — Progress Notes (Signed)

## 2016-08-12 ENCOUNTER — Encounter: Payer: BLUE CROSS/BLUE SHIELD | Admitting: Dietician

## 2016-08-12 ENCOUNTER — Encounter: Payer: Self-pay | Admitting: Dietician

## 2016-08-12 VITALS — BP 130/90 | Ht 72.0 in | Wt 241.0 lb

## 2016-08-12 DIAGNOSIS — E119 Type 2 diabetes mellitus without complications: Secondary | ICD-10-CM

## 2016-08-12 NOTE — Progress Notes (Signed)

## 2016-08-16 ENCOUNTER — Encounter: Payer: Self-pay | Admitting: *Deleted

## 2017-05-21 ENCOUNTER — Other Ambulatory Visit: Payer: Self-pay

## 2017-05-21 ENCOUNTER — Emergency Department: Payer: BLUE CROSS/BLUE SHIELD

## 2017-05-21 ENCOUNTER — Encounter: Payer: Self-pay | Admitting: Emergency Medicine

## 2017-05-21 ENCOUNTER — Emergency Department
Admission: EM | Admit: 2017-05-21 | Discharge: 2017-05-21 | Disposition: A | Discharge status: Home / Self Care | Payer: BLUE CROSS/BLUE SHIELD | Attending: Emergency Medicine | Admitting: Emergency Medicine

## 2017-05-21 DIAGNOSIS — E119 Type 2 diabetes mellitus without complications: Secondary | ICD-10-CM | POA: Insufficient documentation

## 2017-05-21 DIAGNOSIS — R109 Unspecified abdominal pain: Secondary | ICD-10-CM | POA: Diagnosis present

## 2017-05-21 DIAGNOSIS — N132 Hydronephrosis with renal and ureteral calculous obstruction: Secondary | ICD-10-CM | POA: Diagnosis not present

## 2017-05-21 DIAGNOSIS — Z7984 Long term (current) use of oral hypoglycemic drugs: Secondary | ICD-10-CM | POA: Diagnosis not present

## 2017-05-21 DIAGNOSIS — N200 Calculus of kidney: Secondary | ICD-10-CM

## 2017-05-21 HISTORY — DX: Calculus of kidney: N20.0

## 2017-05-21 LAB — BASIC METABOLIC PANEL
Anion gap: 7 (ref 5–15)
BUN: 20 mg/dL (ref 6–20)
CHLORIDE: 104 mmol/L (ref 101–111)
CO2: 28 mmol/L (ref 22–32)
Calcium: 9.7 mg/dL (ref 8.9–10.3)
Creatinine, Ser: 1.11 mg/dL (ref 0.61–1.24)
GFR calc Af Amer: >60 mL/min (ref >60)
GLUCOSE: 112 mg/dL — AB (ref 65–99)
POTASSIUM: 4.2 mmol/L (ref 3.5–5.1)
Sodium: 139 mmol/L (ref 135–145)

## 2017-05-21 LAB — URINALYSIS, COMPLETE (UACMP) WITH MICROSCOPIC
Bilirubin Urine: NEGATIVE
Glucose, UA: NEGATIVE mg/dL
Ketones, ur: NEGATIVE mg/dL
Leukocytes, UA: NEGATIVE
Nitrite: NEGATIVE
PH: 5 (ref 5.0–8.0)
Protein, ur: 100 mg/dL — AB
RBC / HPF: >50 RBC/hpf — ABNORMAL HIGH (ref 0–5)
Specific Gravity, Urine: 1.024 (ref 1.005–1.030)
Squamous Epithelial / LPF: NONE SEEN (ref 0–5)

## 2017-05-21 LAB — CBC
HEMATOCRIT: 43.7 % (ref 40.0–52.0)
Hemoglobin: 15.4 g/dL (ref 13.0–18.0)
MCH: 31.6 pg (ref 26.0–34.0)
MCHC: 35.3 g/dL (ref 32.0–36.0)
MCV: 89.5 fL (ref 80.0–100.0)
PLATELETS: 243 10*3/uL (ref 150–440)
RBC: 4.88 MIL/uL (ref 4.40–5.90)
RDW: 13.4 % (ref 11.5–14.5)
WBC: 8.9 10*3/uL (ref 3.8–10.6)

## 2017-05-21 MED ORDER — CEPHALEXIN 500 MG PO CAPS
500.0000 mg | ORAL_CAPSULE | Freq: Once | ORAL | Status: AC
  Administered 2017-05-21: 500 mg via ORAL
  Filled 2017-05-21: qty 1

## 2017-05-21 MED ORDER — SODIUM CHLORIDE 0.9 % IV BOLUS
1000.0000 mL | Freq: Once | INTRAVENOUS | Status: AC
  Administered 2017-05-21: 1000 mL via INTRAVENOUS

## 2017-05-21 MED ORDER — ONDANSETRON HCL 4 MG/2ML IJ SOLN
4.0000 mg | Freq: Once | INTRAMUSCULAR | Status: AC
  Administered 2017-05-21: 4 mg via INTRAVENOUS

## 2017-05-21 MED ORDER — ONDANSETRON HCL 4 MG/2ML IJ SOLN
INTRAMUSCULAR | Status: AC
  Filled 2017-05-21: qty 2

## 2017-05-21 MED ORDER — CEPHALEXIN 500 MG PO CAPS: 500.0000 mg | ORAL_CAPSULE | Freq: Two times a day (BID) | ORAL | 0 refills | Status: AC

## 2017-05-21 MED ORDER — TAMSULOSIN HCL 0.4 MG PO CAPS
0.4000 mg | ORAL_CAPSULE | Freq: Every day | ORAL | 0 refills | Status: DC
Start: 2017-05-21 — End: 2019-12-15

## 2017-05-21 MED ORDER — OXYCODONE-ACETAMINOPHEN 5-325 MG PO TABS
1.0000 | ORAL_TABLET | ORAL | 0 refills | Status: AC | PRN
Start: 2017-05-21 — End: 2018-05-21

## 2017-05-21 MED ORDER — FENTANYL CITRATE (PF) 100 MCG/2ML IJ SOLN
50.0000 ug | INTRAMUSCULAR | Status: DC | PRN
  Administered 2017-05-21: 50 ug via INTRAVENOUS

## 2017-05-21 MED ORDER — KETOROLAC TROMETHAMINE 30 MG/ML IJ SOLN
30.0000 mg | Freq: Once | INTRAMUSCULAR | Status: AC
  Administered 2017-05-21: 30 mg via INTRAVENOUS

## 2017-05-21 MED ORDER — KETOROLAC TROMETHAMINE 30 MG/ML IJ SOLN
INTRAMUSCULAR | Status: AC
  Filled 2017-05-21: qty 1

## 2017-05-21 MED ORDER — FENTANYL CITRATE (PF) 100 MCG/2ML IJ SOLN
INTRAMUSCULAR | Status: AC
  Filled 2017-05-21: qty 2

## 2017-05-21 NOTE — ED Provider Notes (Signed)
[facility] Emergency Department Provider Note  ___________________________________________   First MD Initiated Contact with Patient 05/21/17 2032     (approximate)  I have reviewed the triage vital signs and the nursing notes.   HISTORY  Chief Complaint Flank Pain   HPI Bradley Gomez is a 57 y.o. male with a history of kidney stones as well as diabetes who is presenting with left flank and left lower quadrant abdominal pain that is been worsening over the past 1 to 2 days.  He says the pain is sharp and a 9 out of 10 at this time.  Associate with nausea and vomiting.  Denies fever.  Says that it feels like past kidney stones.  Given fentanyl as per protocol order but still with 9 out of 10 pain.   Past Medical History:  Diagnosis Date  . Diabetes mellitus without complication (HCC)   . Kidney stones     There are no active problems to display for this patient.   Past Surgical History:  Procedure Laterality Date  . CYSTOSCOPY WITH STENT PLACEMENT Left 06/30/2014   Procedure: CYSTOSCOPY WITH STENT PLACEMENT;  Surgeon: Orson Ape, MD;  Location: [facility] ORS;  Service: Urology;  Laterality: Left;  . EXTRACORPOREAL SHOCK WAVE LITHOTRIPSY Left 07/07/2014   Procedure: EXTRACORPOREAL SHOCK WAVE LITHOTRIPSY (ESWL);  Surgeon: Orson Ape, MD;  Location: [facility] ORS;  Service: Urology;  Laterality: Left;    Prior to Admission medications   Medication Sig Start Date End Date Taking? Authorizing Provider  glipiZIDE (GLUCOTROL XL) 10 MG 24 hr tablet  06/27/16   [provider]  metFORMIN (GLUCOPHAGE-XR) 500 MG 24 hr tablet Take 2 tablets by mouth 2 (two) times daily. 05/27/16 05/27/17  [provider]    Allergies Patient has no known allergies.  Family History  Problem Relation Age of Onset  . COPD Mother   . Diabetes Father   . Diabetes Paternal Grandmother   . Diabetes Paternal Grandfather     Social History Social History    Tobacco Use  . Smoking status: Never Smoker  . Smokeless tobacco: Never Used  Substance Use Topics  . Alcohol use: No  . Drug use: No    Review of Systems  Constitutional: No fever/chills Eyes: No visual changes. ENT: No sore throat. Cardiovascular: Denies chest pain. Respiratory: Denies shortness of breath. Gastrointestinal:  No diarrhea.  No constipation. Genitourinary: Negative for dysuria. Musculoskeletal: As above Skin: Negative for rash. Neurological: Negative for headaches, focal weakness or numbness.   ____________________________________________   PHYSICAL EXAM:  VITAL SIGNS: ED Triage Vitals  Enc Vitals Group     BP 05/21/17 1841 (!) 135/92     Pulse Rate 05/21/17 1841 80     Resp 05/21/17 1841 16     Temp 05/21/17 1841 98 F (36.7 C)     Temp Source 05/21/17 1841 Oral     SpO2 05/21/17 1841 98 %     Weight 05/21/17 1842 250 lb (113.4 kg)     Height 05/21/17 1842 6' (1.829 m)     Head Circumference --      Peak Flow --      Pain Score 05/21/17 1842 8     Pain Loc --      Pain Edu? --      Excl. in GC? --     Constitutional: Alert and oriented.  Appears uncomfortable.  Holding the left lower quadrant with his hand. Eyes: Conjunctivae are normal.  Head: Atraumatic. Nose: No congestion/rhinnorhea. Mouth/Throat: Mucous membranes are moist.  Neck: No stridor.   Cardiovascular: Normal rate, regular rhythm. Grossly normal heart sounds.  Respiratory: Normal respiratory effort.  No retractions. Lungs CTAB. Gastrointestinal: Soft with moderate left lower quadrant tenderness to palpation without rebound or guarding.  No distention.  Left-sided CVA tenderness to palpation. Musculoskeletal: No lower extremity tenderness nor edema.  No joint effusions. Neurologic:  Normal speech and language. No gross focal neurologic deficits are appreciated. Skin:  Skin is warm, dry and intact. No rash noted. Psychiatric: Mood and affect are normal. Speech and behavior are  normal.  ____________________________________________   LABS (all labs ordered are listed, but only abnormal results are displayed)  Labs Reviewed  URINALYSIS, COMPLETE (UACMP) WITH MICROSCOPIC - Abnormal; Notable for the following components:      Result Value   Color, Urine AMBER (*)    APPearance CLOUDY (*)    Hgb urine dipstick LARGE (*)    Protein, ur 100 (*)    RBC / HPF >50 (*)    Bacteria, UA RARE (*)    All other components within normal limits  BASIC METABOLIC PANEL - Abnormal; Notable for the following components:   Glucose, Bld 112 (*)    All other components within normal limits  CBC   ____________________________________________  EKG   ____________________________________________  RADIOLOGY  Midureteral 4 mm stone on the left as well as 8 millimeter, proximal ureteral stone on the right both but is obstructing. ____________________________________________   PROCEDURES  Procedure(s) performed:   Procedures  Critical Care performed:   ____________________________________________   INITIAL IMPRESSION / ASSESSMENT AND PLAN / ED COURSE  Pertinent labs & imaging results that were available during my care of the patient were reviewed by me and considered in my medical decision making (see chart for details).  Differential diagnosis includes, but is not limited to, acute appendicitis, renal colic, testicular torsion, urinary tract infection/pyelonephritis, prostatitis,  epididymitis, diverticulitis, small bowel obstruction or ileus, colitis, abdominal aortic aneurysm, gastroenteritis, hernia, etc. As part of my medical decision making, I reviewed the following data within the electronic MEDICAL RECORD NUMBER Notes from prior ED visits  ----------------------------------------- 10:12 PM on 05/21/2017 -----------------------------------------  Patient is pain-free after Toradol.  I discussed the case with Dr. Sheppard PentonWolf.  He agrees that it is unlikely that stones  were actually obstructed because of the normal creatinine.  Patient is pain-free.  Dr. Sheppard PentonWolf would like the patient to follow-up tomorrow in the office.  Patient will be discharged with Keflex for white blood cells in the urine.  However, it is most likely that it is the large amount of red blood cells accompanied with white blood cells and not overt UTI.  Also with a normal white blood cell count.  Patient aware the diagnosis as well as treatment plan willing to comply.  He knows to return to the emergency department for any worsening or concerning symptoms but especially worsening pain or fever.  Also aware that he must call Dr. Daleen SquibbWall first thing in the morning.  ____________________________________________   FINAL CLINICAL IMPRESSION(S) / ED DIAGNOSES  Kidney stones.    NEW MEDICATIONS STARTED DURING THIS VISIT:  New Prescriptions   No medications on file     Note:  This document was prepared using Dragon voice recognition software and may include unintentional dictation errors.     Myrna BlazerSchaevitz, Ramadan Couey Matthew, MD 05/21/17 2213

## 2017-05-21 NOTE — ED Triage Notes (Signed)
Left flank pain that began last pm, hx of kidney stones, vomited yesterday, nothing to vomit today per pt.

## 2017-05-21 NOTE — ED Notes (Signed)

## 2017-05-23 LAB — URINE CULTURE: Culture: <10000 — AB

## 2017-06-05 ENCOUNTER — Encounter: Payer: Self-pay | Admitting: *Deleted

## 2017-06-05 ENCOUNTER — Other Ambulatory Visit: Payer: Self-pay

## 2017-06-05 ENCOUNTER — Encounter
Admission: RE | Disposition: A | Discharge status: Home / Self Care | Payer: Self-pay | Source: Ambulatory Visit | Attending: Urology

## 2017-06-05 ENCOUNTER — Ambulatory Visit
Admission: RE | Admit: 2017-06-05 | Discharge: 2017-06-05 | Disposition: A | Discharge status: Home / Self Care | Payer: BLUE CROSS/BLUE SHIELD | Source: Ambulatory Visit | Attending: Urology | Admitting: Urology

## 2017-06-05 DIAGNOSIS — E119 Type 2 diabetes mellitus without complications: Secondary | ICD-10-CM | POA: Insufficient documentation

## 2017-06-05 DIAGNOSIS — E7800 Pure hypercholesterolemia, unspecified: Secondary | ICD-10-CM | POA: Insufficient documentation

## 2017-06-05 DIAGNOSIS — Z79899 Other long term (current) drug therapy: Secondary | ICD-10-CM | POA: Insufficient documentation

## 2017-06-05 DIAGNOSIS — N201 Calculus of ureter: Secondary | ICD-10-CM | POA: Insufficient documentation

## 2017-06-05 DIAGNOSIS — Z7984 Long term (current) use of oral hypoglycemic drugs: Secondary | ICD-10-CM | POA: Diagnosis not present

## 2017-06-05 HISTORY — PX: EXTRACORPOREAL SHOCK WAVE LITHOTRIPSY: SHX1557

## 2017-06-05 LAB — GLUCOSE, CAPILLARY: Glucose-Capillary: 82 mg/dL (ref 65–99)

## 2017-06-05 SURGERY — LITHOTRIPSY, ESWL
Anesthesia: Moderate Sedation | Laterality: Right

## 2017-06-05 MED ORDER — DIPHENHYDRAMINE HCL 25 MG PO CAPS
ORAL_CAPSULE | ORAL | Status: AC
  Administered 2017-06-05: 25 mg via ORAL
  Filled 2017-06-05: qty 1

## 2017-06-05 MED ORDER — LEVOFLOXACIN 500 MG PO TABS: 500.0000 mg | ORAL_TABLET | Freq: Every day | ORAL | 0 refills | Status: DC

## 2017-06-05 MED ORDER — FUROSEMIDE 10 MG/ML IJ SOLN
INTRAMUSCULAR | Status: AC
  Filled 2017-06-05: qty 2

## 2017-06-05 MED ORDER — DIPHENHYDRAMINE HCL 25 MG PO CAPS
25.0000 mg | ORAL_CAPSULE | ORAL | Status: AC
  Administered 2017-06-05: 25 mg via ORAL

## 2017-06-05 MED ORDER — MORPHINE SULFATE (PF) 10 MG/ML IV SOLN
10.0000 mg | Freq: Once | INTRAVENOUS | Status: AC
  Administered 2017-06-05: 10 mg via INTRAMUSCULAR

## 2017-06-05 MED ORDER — PROMETHAZINE HCL 25 MG/ML IJ SOLN
INTRAMUSCULAR | Status: AC
  Administered 2017-06-05: 25 mg via INTRAMUSCULAR
  Filled 2017-06-05: qty 1

## 2017-06-05 MED ORDER — ONDANSETRON 8 MG PO TBDP: 8.0000 mg | ORAL_TABLET | Freq: Four times a day (QID) | ORAL | 3 refills | Status: DC | PRN

## 2017-06-05 MED ORDER — MIDAZOLAM HCL 2 MG/2ML IJ SOLN
1.0000 mg | Freq: Once | INTRAMUSCULAR | Status: AC
  Administered 2017-06-05: 1 mg via INTRAMUSCULAR

## 2017-06-05 MED ORDER — LEVOFLOXACIN 500 MG PO TABS
ORAL_TABLET | ORAL | Status: AC
  Administered 2017-06-05: 500 mg via ORAL
  Filled 2017-06-05: qty 1

## 2017-06-05 MED ORDER — MORPHINE SULFATE (PF) 10 MG/ML IV SOLN
INTRAVENOUS | Status: AC
  Administered 2017-06-05: 10 mg via INTRAMUSCULAR
  Filled 2017-06-05: qty 1

## 2017-06-05 MED ORDER — DEXTROSE-NACL 5-0.45 % IV SOLN: INTRAVENOUS | Status: DC

## 2017-06-05 MED ORDER — FUROSEMIDE 10 MG/ML IJ SOLN
10.0000 mg | Freq: Once | INTRAMUSCULAR | Status: AC
  Administered 2017-06-05: 10 mg via INTRAVENOUS

## 2017-06-05 MED ORDER — NUCYNTA 50 MG PO TABS: 50.0000 mg | ORAL_TABLET | Freq: Four times a day (QID) | ORAL | 0 refills | Status: DC | PRN

## 2017-06-05 MED ORDER — PROMETHAZINE HCL 25 MG/ML IJ SOLN
25.0000 mg | Freq: Once | INTRAMUSCULAR | Status: AC
  Administered 2017-06-05: 25 mg via INTRAMUSCULAR

## 2017-06-05 MED ORDER — MIDAZOLAM HCL 2 MG/2ML IJ SOLN
INTRAMUSCULAR | Status: AC
  Administered 2017-06-05: 1 mg via INTRAMUSCULAR
  Filled 2017-06-05: qty 2

## 2017-06-05 MED ORDER — LEVOFLOXACIN 500 MG PO TABS
500.0000 mg | ORAL_TABLET | ORAL | Status: AC
  Administered 2017-06-05: 500 mg via ORAL

## 2017-06-05 NOTE — Discharge Instructions (Addendum)
Lithotripsy Lithotripsy is a treatment that can sometimes help eliminate kidney stones and the pain that they cause. A form of lithotripsy, also known as extracorporeal shock wave lithotripsy, is a nonsurgical procedure that crushes a kidney stone with shock waves. These shock waves pass through your body and focus on the kidney stone. They cause the kidney stone to break up while it is still in the urinary tract. This makes it easier for the smaller pieces of stone to pass in the urine. Tell a health care provider about:  Any allergies you have.  All medicines you are taking, including vitamins, herbs, eye drops, creams, and over-the-counter medicines.  Any blood disorders you have.  Any surgeries you have had.  Any medical conditions you have.  Whether you are pregnant or may be pregnant.  Any problems you or family members have had with anesthetic medicines. What are the risks? Generally, this is a safe procedure. However, problems may occur, including:  Infection.  Bleeding of the kidney.  Bruising of the kidney or skin.  Scarring of the kidney, which can lead to: ? Increased blood pressure. ? Poor kidney function. ? Return (recurrence) of kidney stones.  Damage to other structures or organs, such as the liver, colon, spleen, or pancreas.  Blockage (obstruction) of the the tube that carries urine from the kidney to the bladder (ureter).  Failure of the kidney stone to break into pieces (fragments).  What happens before the procedure? Staying hydrated Follow instructions from your health care provider about hydration, which may include:  Up to 2 hours before the procedure - you may continue to drink clear liquids, such as water, clear fruit juice, black coffee, and plain tea.  Eating and drinking restrictions Follow instructions from your health care provider about eating and drinking, which may include:  8 hours before the procedure - stop eating heavy meals or  foods such as meat, fried foods, or fatty foods.  6 hours before the procedure - stop eating light meals or foods, such as toast or cereal.  6 hours before the procedure - stop drinking milk or drinks that contain milk.  2 hours before the procedure - stop drinking clear liquids.  General instructions  Plan to have someone take you home from the hospital or clinic.  Ask your health care provider about: ? Changing or stopping your regular medicines. This is especially important if you are taking diabetes medicines or blood thinners. ? Taking medicines such as aspirin and ibuprofen. These medicines and other NSAIDs can thin your blood. Do not take these medicines for 7 days before your procedure if your health care provider instructs you not to.  You may have tests, such as: ? Blood tests. ? Urine tests. ? Imaging tests, such as a CT scan. What happens during the procedure?  To lower your risk of infection: ? Your health care team will wash or sanitize their hands. ? Your skin will be washed with soap.  An IV tube will be inserted into one of your veins. This tube will give you fluids and medicines.  You will be given one or more of the following: ? A medicine to help you relax (sedative). ? A medicine to make you fall asleep (general anesthetic).  A water-filled cushion may be placed behind your kidney or on your abdomen. In some cases you may be placed in a tub of lukewarm water.  Your body will be positioned in a way that makes it easy to  target the kidney stone.  A flexible tube with holes in it (stent) may be placed in the ureter. This will help keep urine flowing from the kidney if the fragments of the stone have been blocking the ureter.  An X-ray or ultrasound exam will be done to locate your stone.  Shock waves will be aimed at the stone. If you are awake, you may feel a tapping sensation as the shock waves pass through your body. The procedure may vary among health  care providers and hospitals. What happens after the procedure?  You may have an X-ray to see whether the procedure was able to break up the kidney stone and how much of the stone has passed. If large stone fragments remain after treatment, you may need to have a second procedure at a later time.  Your blood pressure, heart rate, breathing rate, and blood oxygen level will be monitored until the medicines you were given have worn off.  You may be given antibiotics or pain medicine as needed.  If a stent was placed in your ureter during surgery, it may stay in place for a few weeks.  You may need strain your urine to collect pieces of the kidney stone for testing.  You will need to drink plenty of water.  Do not drive for 24 hours if you were given a sedative. Summary  Lithotripsy is a treatment that can sometimes help eliminate kidney stones and the pain that they cause.  A form of lithotripsy, also known as extracorporeal shock wave lithotripsy, is a nonsurgical procedure that crushes a kidney stone with shock waves.  Generally, this is a safe procedure. However, problems may occur, including damage to the kidney or other organs, infection, or obstruction of the tube that carries urine from the kidney to the bladder (ureter).  When you go home, you will need to drink plenty of water. You may be asked to strain your urine to collect pieces of the kidney stone for testing. This information is not intended to replace advice given to you by your health care provider. Make sure you discuss any questions you have with your health care provider. Document Released: 01/12/2000 Document Revised: 12/06/2015 Document Reviewed: 12/06/2015 Elsevier Interactive Patient Education  2018 [employer]. Lithotripsy, Care After This sheet gives you information about how to care for yourself after your procedure. Your health care provider may also give you more specific instructions. If you have problems or  questions, contact your health care provider. What can I expect after the procedure? After the procedure, it is common to have:  Some blood in your urine. This should only last for a few days.  Soreness in your back, sides, or upper abdomen for a few days.  Blotches or bruises on your back where the pressure wave entered the skin.  Pain, discomfort, or nausea when pieces (fragments) of the kidney stone move through the tube that carries urine from the kidney to the bladder (ureter). Stone fragments may pass soon after the procedure, but they may continue to pass for up to 4-8 weeks. ? If you have severe pain or nausea, contact your health care provider. This may be caused by a large stone that was not broken up, and this may mean that you need more treatment.  Some pain or discomfort during urination.  Some pain or discomfort in the lower abdomen or (in men) at the base of the penis.  Follow these instructions at home: Medicines  Take over-the-counter and  prescription medicines only as told by your health care provider.  If you were prescribed an antibiotic medicine, take it as told by your health care provider. Do not stop taking the antibiotic even if you start to feel better.  Do not drive for 24 hours if you were given a medicine to help you relax (sedative).  Do not drive or use heavy machinery while taking prescription pain medicine. Eating and drinking  Drink enough water and fluids to keep your urine clear or pale yellow. This helps any remaining pieces of the stone to pass. It can also help prevent new stones from forming.  Eat plenty of fresh fruits and vegetables.  Follow instructions from your health care provider about eating and drinking restrictions. You may be instructed: ? To reduce how much salt (sodium) you eat or drink. Check ingredients and nutrition facts on packaged foods and beverages. ? To reduce how much meat you eat.  Eat the recommended amount of  calcium for your age and gender. Ask your health care provider how much calcium you should have. General instructions  Get plenty of rest.  Most people can resume normal activities 1-2 days after the procedure. Ask your health care provider what activities are safe for you.  If directed, strain all urine through the strainer that was provided by your health care provider. ? Keep all fragments for your health care provider to see. Any stones that are found may be sent to a medical lab for examination. The stone may be as small as a grain of salt.  Keep all follow-up visits as told by your health care provider. This is important. Contact a health care provider if:  You have pain that is severe or does not get better with medicine.  You have nausea that is severe or does not go away.  You have blood in your urine longer than your health care provider told you to expect.  You have more blood in your urine.  You have pain during urination that does not go away.  You urinate more frequently than usual and this does not go away.  You develop a rash or any other possible signs of an allergic reaction. Get help right away if:  You have severe pain in your back, sides, or upper abdomen.  You have severe pain while urinating.  Your urine is very dark red.  You have blood in your stool (feces).  You cannot pass any urine at all.  You feel a strong urge to urinate after emptying your bladder.  You have a fever or chills.  You develop shortness of breath, difficulty breathing, or chest pain.  You have severe nausea that leads to persistent vomiting.  You faint. Summary  After this procedure, it is common to have some pain, discomfort, or nausea when pieces (fragments) of the kidney stone move through the tube that carries urine from the kidney to the bladder (ureter). If this pain or nausea is severe, however, you should contact your health care provider.  Most people can resume  normal activities 1-2 days after the procedure. Ask your health care provider what activities are safe for you.  Drink enough water and fluids to keep your urine clear or pale yellow. This helps any remaining pieces of the stone to pass, and it can help prevent new stones from forming.  If directed, strain your urine and keep all fragments for your health care provider to see. Fragments or stones may be as small  as a grain of salt.  Get help right away if you have severe pain in your back, sides, or upper abdomen or have severe pain while urinating. This information is not intended to replace advice given to you by your health care provider. Make sure you discuss any questions you have with your health care provider. Document Released: 02/03/2007 Document Revised: 12/06/2015 Document Reviewed: 12/06/2015 Elsevier Interactive Patient Education  2018 [employer]   AMBULATORY SURGERY  DISCHARGE INSTRUCTIONS   1) The drugs that you were given will stay in your system until tomorrow so for the next 24 hours you should not:  A) Drive an automobile B) Make any legal decisions C) Drink any alcoholic beverage   2) You may resume regular meals tomorrow.  Today it is better to start with liquids and gradually work up to solid foods.  You may eat anything you prefer, but it is better to start with liquids, then soup and crackers, and gradually work up to solid foods.   3) Please notify your doctor immediately if you have any unusual bleeding, trouble breathing, redness and pain at the surgery site, drainage, fever, or pain not relieved by medication.    4) Additional Instructions:    Please contact your physician with any problems or Same Day Surgery at [phone], Monday through Friday 6 am to 4 pm, or [place] [facility] number at [phone].

## 2017-06-06 ENCOUNTER — Encounter: Payer: Self-pay | Admitting: Urology

## 2019-01-05 IMAGING — CT CT RENAL STONE PROTOCOL
2 of 4 series · 15 of 46 positions shown, 17 images · non-contrast
Comparison: None available.

CLINICAL DATA: Initial evaluation for acute left flank pain.

EXAM:
CT ABDOMEN AND PELVIS WITHOUT CONTRAST
TECHNIQUE: Multidetector CT imaging of the abdomen and pelvis was performed
following the standard protocol without IV contrast.

[Series 2: stone full standard · axial · 0.81mm/px · z∈[-550,-46]mm · 12 of 111 slices shown, 14 images]
[im 5/111  soft-tissue]
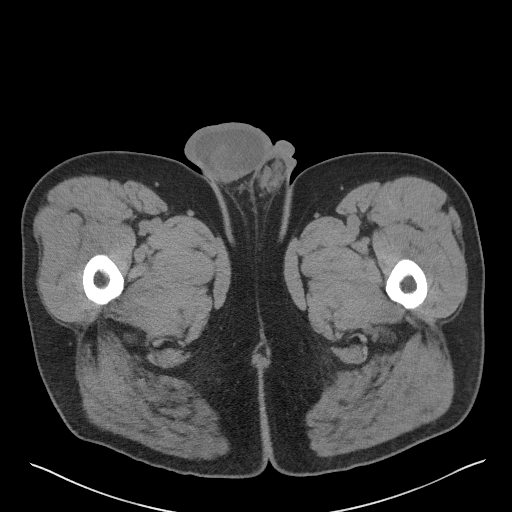
[im 5/111  bone]
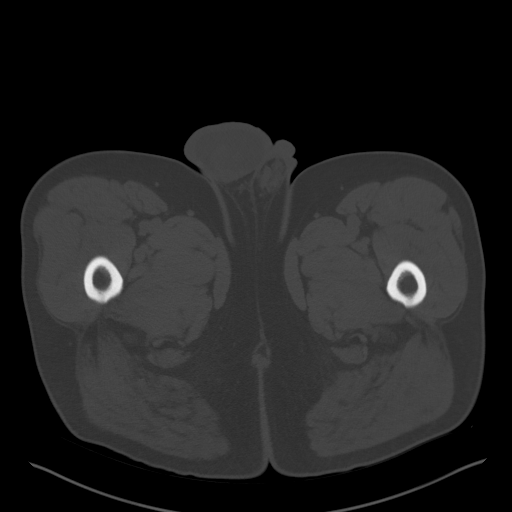
[im 14/111  soft-tissue]
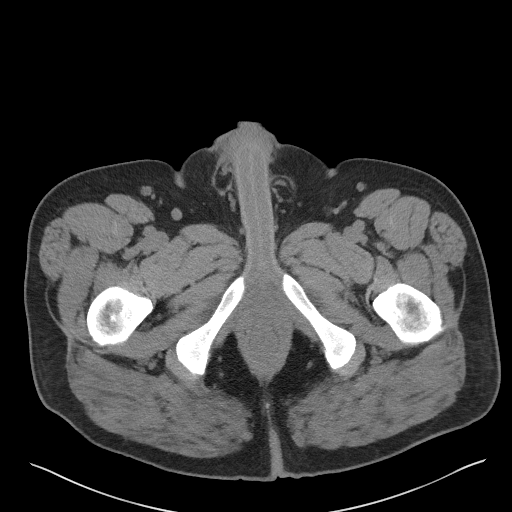
[im 23/111  soft-tissue]
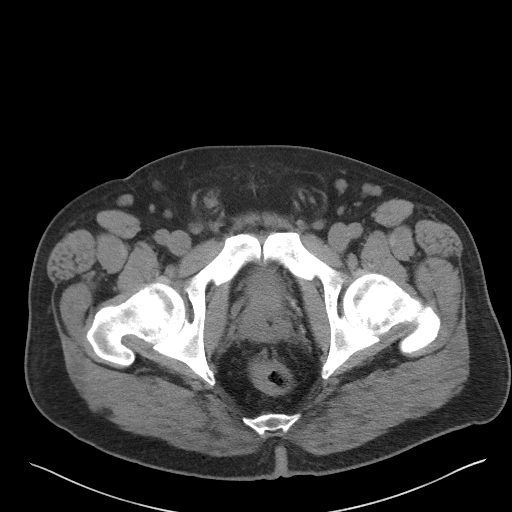
[im 33/111  soft-tissue]
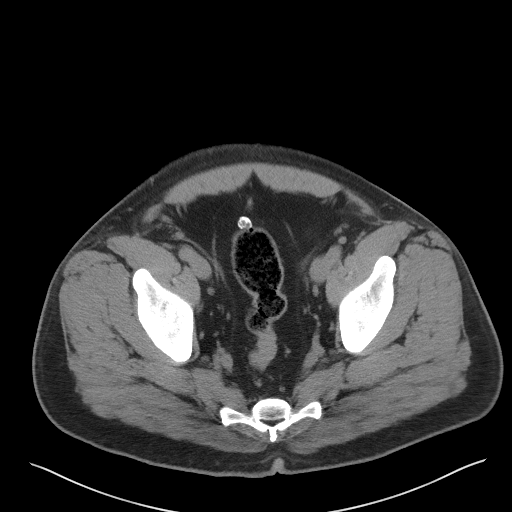
[im 42/111  soft-tissue]
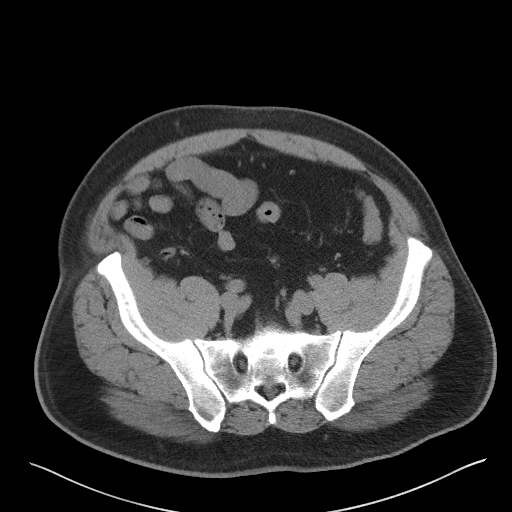
[im 51/111  soft-tissue]
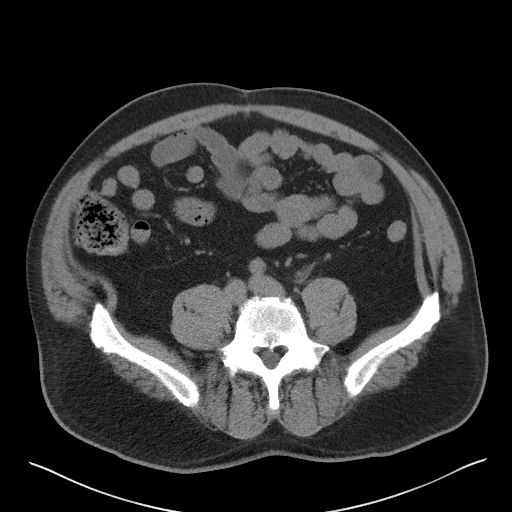
[im 60/111  soft-tissue]
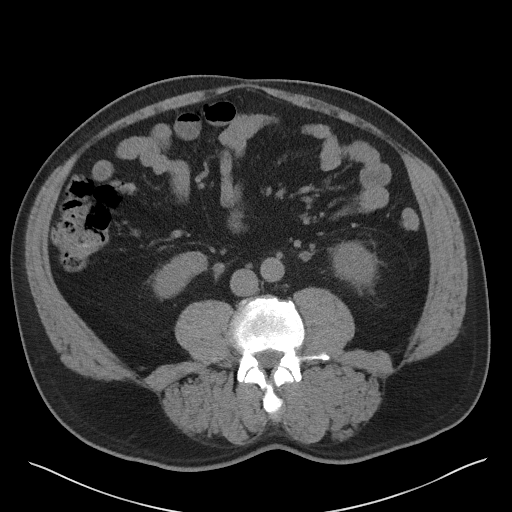
[im 69/111  soft-tissue]
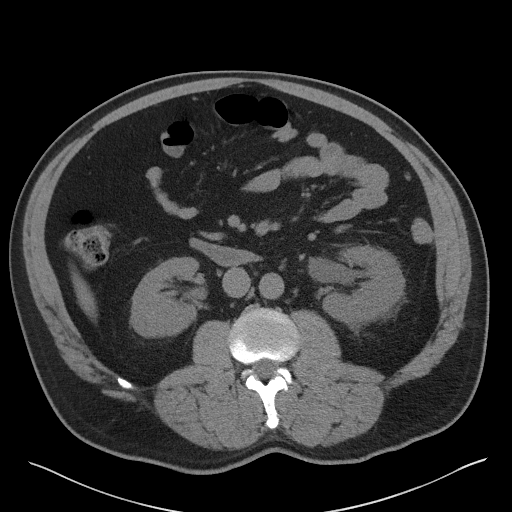
[im 78/111  soft-tissue]
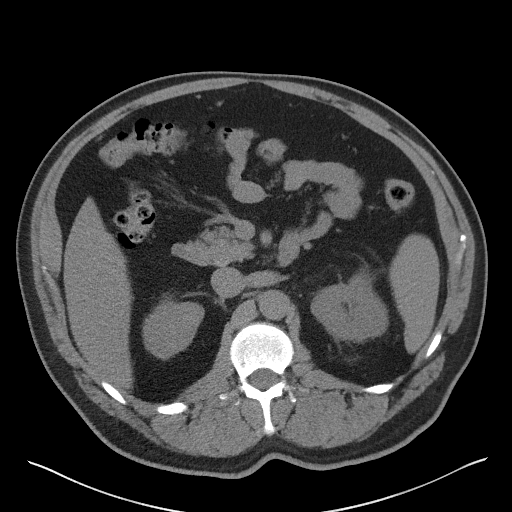
[im 78/111  bone]
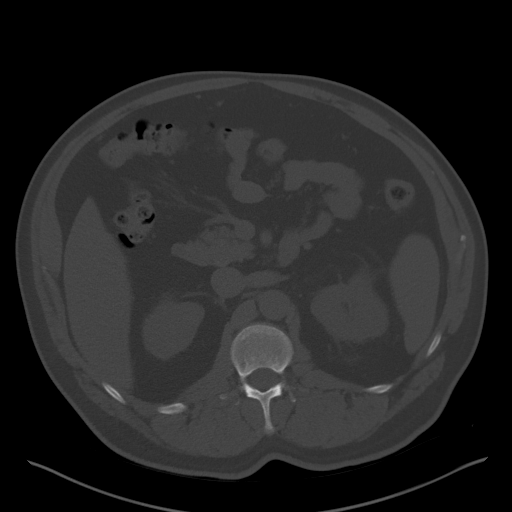
[im 88/111  soft-tissue]
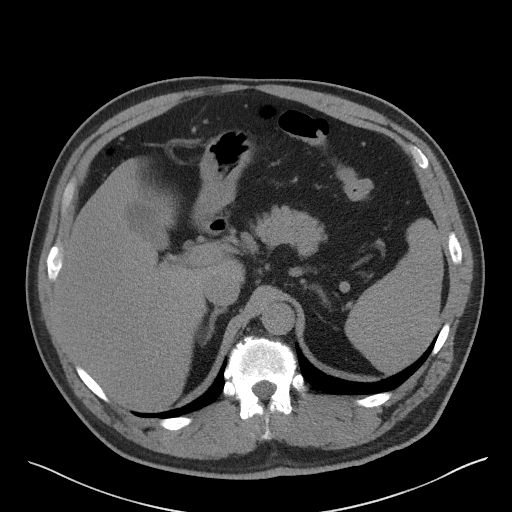
[im 97/111  soft-tissue]
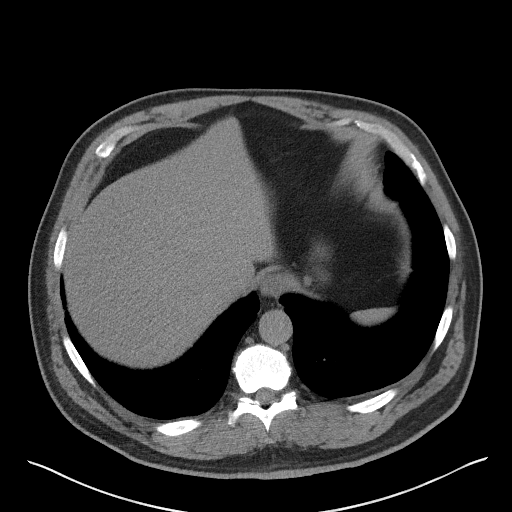
[im 106/111  soft-tissue]
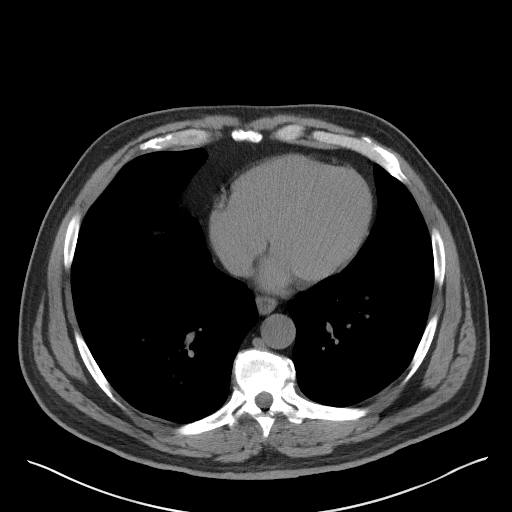

[Series 5: coronal · coronal · 0.84mm/px · 3 of 147 slices shown]
[im 49/147  soft-tissue]
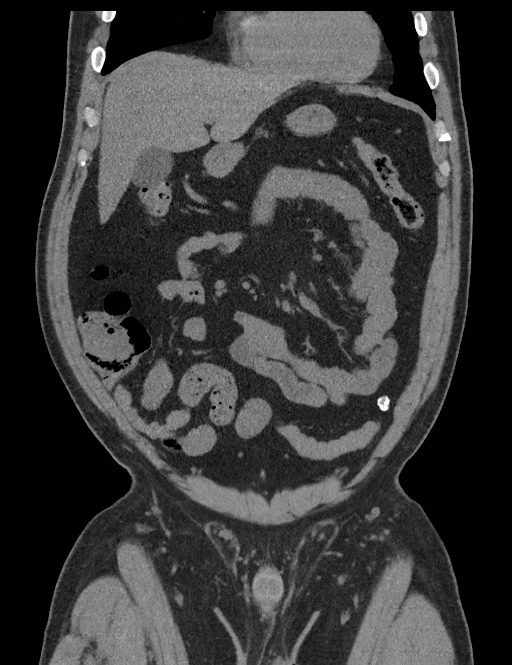
[im 65/147  soft-tissue]
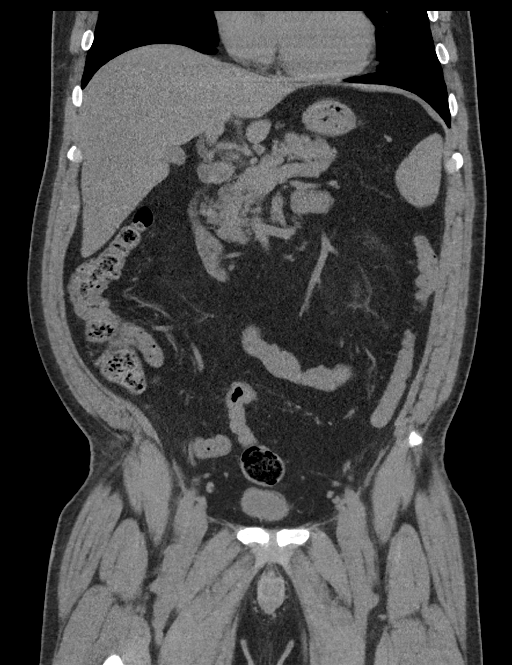
[im 82/147  soft-tissue]
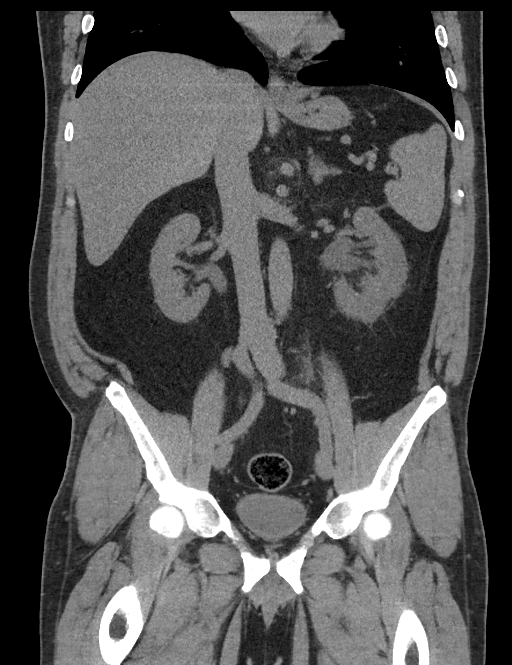

[15 of 46 positions shown; findings below may reference images not displayed]

FINDINGS: Lower chest: Visualized lung bases are clear.

Hepatobiliary: Liver demonstrates a normal unenhanced appearance.
Gallbladder normal. No biliary dilatation.

Pancreas: Pancreas demonstrates no acute abnormality. Pancreas is
somewhat truncated with absence of the pancreatic tail.

Spleen: Normal.

Adrenals/Urinary Tract: Adrenal glands are normal.

There is an obstructive 4 mm stone positioned within the mid left
ureter with secondary mild to moderate left hydroureteronephrosis.
Associated perinephric and periureteral fat stranding. No other
left-sided renal calculi identified.

On the right, there is an obstructive 8 mm stone within the proximal
right ureter. Secondary mild right hydroureter without significant
hydronephrosis. No other calculi seen distally within the right
ureter. Additional nonobstructive calculi measuring up to 3 mm
present within the lower pole of the right kidney. Subcentimeter
hypodensity within the interpolar/lower pole the right kidney not
well evaluated on this noncontrast examination, but likely reflects
a small cyst.

Bladder largely decompressed without acute abnormality. No layering
stones within the bladder lumen.

Stomach/Bowel: Stomach within normal limits. No evidence for bowel
obstruction. Appendix is normal. No acute inflammatory changes seen
about the bowels.

Vascular/Lymphatic: Intra-abdominal aorta of normal caliber. No
pathologically enlarged intra-abdominopelvic lymph nodes.

Reproductive: Prostate within normal limits.

Other: Small bilateral fat containing inguinal hernias. Bilateral
hydroceles partially visualized. No free air or fluid.

Musculoskeletal: No acute osseous abnormality. No worrisome lytic or
blastic osseous lesions. Degenerative spondylolysis noted at L5-S1.
IMPRESSION: 1. 4 mm obstructive stone within the mid left ureter with secondary
mild to moderate left hydroureteronephrosis.
2. Additional obstructive 8 mm stone within the proximal right
ureter. Secondary mild right hydroureter without significant
hydronephrosis.
3. Punctate nonobstructive right renal nephrolithiasis.
4. Bilateral hydroceles.

## 2019-12-15 ENCOUNTER — Other Ambulatory Visit: Payer: Self-pay

## 2019-12-15 ENCOUNTER — Encounter: Payer: Self-pay | Admitting: Ophthalmology

## 2019-12-17 ENCOUNTER — Other Ambulatory Visit
Admission: RE | Admit: 2019-12-17 | Discharge: 2019-12-17 | Disposition: A | Discharge status: Home / Self Care | Payer: BC Managed Care – PPO | Source: Ambulatory Visit | Attending: Ophthalmology | Admitting: Ophthalmology

## 2019-12-17 ENCOUNTER — Other Ambulatory Visit: Payer: Self-pay

## 2019-12-17 DIAGNOSIS — Z01812 Encounter for preprocedural laboratory examination: Secondary | ICD-10-CM | POA: Insufficient documentation

## 2019-12-17 DIAGNOSIS — Z20822 Contact with and (suspected) exposure to covid-19: Secondary | ICD-10-CM | POA: Diagnosis not present

## 2019-12-17 NOTE — Discharge Instructions (Signed)

## 2019-12-18 LAB — SARS CORONAVIRUS 2 (TAT 6-24 HRS): SARS Coronavirus 2: NEGATIVE

## 2019-12-21 ENCOUNTER — Other Ambulatory Visit: Payer: Self-pay

## 2019-12-21 ENCOUNTER — Ambulatory Visit
Admission: RE | Admit: 2019-12-21 | Discharge: 2019-12-21 | Disposition: A | Discharge status: Home / Self Care | Payer: BC Managed Care – PPO | Attending: Ophthalmology | Admitting: Ophthalmology

## 2019-12-21 ENCOUNTER — Ambulatory Visit: Payer: BC Managed Care – PPO | Admitting: Anesthesiology

## 2019-12-21 ENCOUNTER — Encounter
Admission: RE | Disposition: A | Discharge status: Home / Self Care | Payer: Self-pay | Source: Home / Self Care | Attending: Ophthalmology

## 2019-12-21 ENCOUNTER — Encounter: Payer: Self-pay | Admitting: Ophthalmology

## 2019-12-21 DIAGNOSIS — Z7984 Long term (current) use of oral hypoglycemic drugs: Secondary | ICD-10-CM | POA: Diagnosis not present

## 2019-12-21 DIAGNOSIS — H2512 Age-related nuclear cataract, left eye: Secondary | ICD-10-CM | POA: Diagnosis not present

## 2019-12-21 DIAGNOSIS — Z79899 Other long term (current) drug therapy: Secondary | ICD-10-CM | POA: Insufficient documentation

## 2019-12-21 DIAGNOSIS — E1136 Type 2 diabetes mellitus with diabetic cataract: Secondary | ICD-10-CM | POA: Insufficient documentation

## 2019-12-21 HISTORY — DX: Personal history of urinary calculi: Z87.442

## 2019-12-21 HISTORY — DX: Hyperlipidemia, unspecified: E78.5

## 2019-12-21 HISTORY — PX: CATARACT EXTRACTION W/PHACO: SHX586

## 2019-12-21 LAB — GLUCOSE, CAPILLARY
Glucose-Capillary: 162 mg/dL — ABNORMAL HIGH (ref 70–99)
Glucose-Capillary: 145 mg/dL — ABNORMAL HIGH (ref 70–99)

## 2019-12-21 SURGERY — PHACOEMULSIFICATION, CATARACT, WITH IOL INSERTION
Anesthesia: Monitor Anesthesia Care | Site: Eye | Laterality: Left

## 2019-12-21 MED ORDER — TETRACAINE HCL 0.5 % OP SOLN
1.0000 [drp] | OPHTHALMIC | Status: DC | PRN
  Administered 2019-12-21 (×3): 1 [drp] via OPHTHALMIC

## 2019-12-21 MED ORDER — MOXIFLOXACIN HCL 0.5 % OP SOLN
OPHTHALMIC | Status: DC | PRN
  Administered 2019-12-21: 0.2 mL via OPHTHALMIC

## 2019-12-21 MED ORDER — EPINEPHRINE PF 1 MG/ML IJ SOLN
INTRAOCULAR | Status: DC | PRN
  Administered 2019-12-21: 83 mL via OPHTHALMIC

## 2019-12-21 MED ORDER — ARMC OPHTHALMIC DILATING DROPS
1.0000 "application " | OPHTHALMIC | Status: DC | PRN
  Administered 2019-12-21 (×3): 1 via OPHTHALMIC

## 2019-12-21 MED ORDER — FENTANYL CITRATE (PF) 100 MCG/2ML IJ SOLN
INTRAMUSCULAR | Status: DC | PRN
  Administered 2019-12-21: 50 ug via INTRAVENOUS

## 2019-12-21 MED ORDER — LACTATED RINGERS IV SOLN: INTRAVENOUS | Status: DC

## 2019-12-21 MED ORDER — ACETAMINOPHEN 10 MG/ML IV SOLN: 1000.0000 mg | Freq: Once | INTRAVENOUS | Status: DC | PRN

## 2019-12-21 MED ORDER — BRIMONIDINE TARTRATE-TIMOLOL 0.2-0.5 % OP SOLN
OPHTHALMIC | Status: DC | PRN
  Administered 2019-12-21: 1 [drp] via OPHTHALMIC

## 2019-12-21 MED ORDER — NA CHONDROIT SULF-NA HYALURON 40-17 MG/ML IO SOLN
INTRAOCULAR | Status: DC | PRN
  Administered 2019-12-21: 1 mL via INTRAOCULAR

## 2019-12-21 MED ORDER — LIDOCAINE HCL (PF) 2 % IJ SOLN
INTRAOCULAR | Status: DC | PRN
  Administered 2019-12-21: 2 mL

## 2019-12-21 MED ORDER — ONDANSETRON HCL 4 MG/2ML IJ SOLN: 4.0000 mg | Freq: Once | INTRAMUSCULAR | Status: DC | PRN

## 2019-12-21 MED ORDER — MIDAZOLAM HCL 2 MG/2ML IJ SOLN
INTRAMUSCULAR | Status: DC | PRN
  Administered 2019-12-21: 2 mg via INTRAVENOUS

## 2019-12-21 SURGICAL SUPPLY — 19 items
CANNULA ANT/CHMB 27G (MISCELLANEOUS) ×2 IMPLANT
CANNULA ANT/CHMB 27GA (MISCELLANEOUS) ×6 IMPLANT
GLOVE SURG LX 8.0 MICRO (GLOVE) ×2
GLOVE SURG LX STRL 8.0 MICRO (GLOVE) ×1 IMPLANT
GLOVE SURG TRIUMPH 8.0 PF LTX (GLOVE) ×3 IMPLANT
GOWN STRL REUS W/ TWL LRG LVL3 (GOWN DISPOSABLE) ×2 IMPLANT
GOWN STRL REUS W/TWL LRG LVL3 (GOWN DISPOSABLE) ×6
LENS IOL TECNIS EYHANCE 20.0 (Intraocular Lens) ×2 IMPLANT
MARKER SKIN DUAL TIP RULER LAB (MISCELLANEOUS) ×3 IMPLANT
NDL FILTER BLUNT 18X1 1/2 (NEEDLE) ×1 IMPLANT
NEEDLE FILTER BLUNT 18X 1/2SAF (NEEDLE) ×2
NEEDLE FILTER BLUNT 18X1 1/2 (NEEDLE) ×1 IMPLANT
PACK EYE AFTER SURG (MISCELLANEOUS) ×3 IMPLANT
PACK OPTHALMIC (MISCELLANEOUS) ×3 IMPLANT
PACK PORFILIO (MISCELLANEOUS) ×3 IMPLANT
SYR 3ML LL SCALE MARK (SYRINGE) ×3 IMPLANT
SYR TB 1ML LUER SLIP (SYRINGE) ×3 IMPLANT
WATER STERILE IRR 250ML POUR (IV SOLUTION) ×3 IMPLANT
WIPE NON LINTING 3.25X3.25 (MISCELLANEOUS) ×3 IMPLANT

## 2019-12-21 NOTE — Op Note (Signed)
PREOPERATIVE DIAGNOSIS:  Nuclear sclerotic cataract of the left eye.   POSTOPERATIVE DIAGNOSIS:  Nuclear sclerotic cataract of the left eye.   OPERATIVE PROCEDURE:@   SURGEON:  Galen Manila, MD.   ANESTHESIA:  Anesthesiologist: Heniser, Burman Foster, MD CRNA: Jimmy Picket, CRNA; Maree Krabbe, CRNA  1.      Managed anesthesia care. 2.     0.42ml of Shugarcaine was instilled following the paracentesis   COMPLICATIONS:  None.   TECHNIQUE:   Stop and chop   DESCRIPTION OF PROCEDURE:  The patient was examined and consented in the preoperative holding area where the aforementioned topical anesthesia was applied to the left eye and then brought back to the Operating Room where the left eye was prepped and draped in the usual sterile ophthalmic fashion and a lid speculum was placed. A paracentesis was created with the side port blade and the anterior chamber was filled with viscoelastic. A near clear corneal incision was performed with the steel keratome. A continuous curvilinear capsulorrhexis was performed with a cystotome followed by the capsulorrhexis forceps. Hydrodissection and hydrodelineation were carried out with BSS on a blunt cannula. The lens was removed in a stop and chop  technique and the remaining cortical material was removed with the irrigation-aspiration handpiece. The capsular bag was inflated with viscoelastic and the Technis [id number] lens was placed in the capsular bag without complication. The remaining viscoelastic was removed from the eye with the irrigation-aspiration handpiece. The wounds were hydrated. The anterior chamber was flushed with BSS and the eye was inflated to physiologic pressure. 0.41ml Vigamox was placed in the anterior chamber. The wounds were found to be water tight. The eye was dressed with Combigan. The patient was given protective glasses to wear throughout the day and a shield with which to sleep tonight. The patient was also given drops with which to begin a  drop regimen today and will follow-up with me in one day. Implant Name Type Inv. Item Serial No. Manufacturer Lot No. LRB No. Used Action  LENS IOL TECNIS EYHANCE 20.0 - [id number] Intraocular Lens LENS IOL TECNIS EYHANCE 20.0 [id number] JOHNSON   Left 1 Implanted    Procedure(s) with comments: CATARACT EXTRACTION PHACO AND INTRAOCULAR LENS PLACEMENT (IOC) LEFT 9.66 00:55.6 (Left) - Diabetic - oral meds  Electronically signed: Galen Manila 12/21/2019 1:02 PM

## 2019-12-21 NOTE — H&P (Signed)
[facility]   Primary Care Physician:  Dione Housekeeper, MD Ophthalmologist: Dr. Druscilla Brownie  Pre-Procedure History & Physical: HPI:  Bradley Gomez is a 59 y.o. male here for cataract surgery.   Past Medical History:  Diagnosis Date  . Diabetes mellitus without complication (HCC)   . History of kidney stones   . Hyperlipidemia   . Kidney stones     Past Surgical History:  Procedure Laterality Date  . CYSTOSCOPY WITH STENT PLACEMENT Left 06/30/2014   Procedure: CYSTOSCOPY WITH STENT PLACEMENT;  Surgeon: Orson Ape, MD;  Location: [facility] ORS;  Service: Urology;  Laterality: Left;  . EXTRACORPOREAL SHOCK WAVE LITHOTRIPSY Left 07/07/2014   Procedure: EXTRACORPOREAL SHOCK WAVE LITHOTRIPSY (ESWL);  Surgeon: Orson Ape, MD;  Location: [facility] ORS;  Service: Urology;  Laterality: Left;  . EXTRACORPOREAL SHOCK WAVE LITHOTRIPSY Right 06/05/2017   Procedure: EXTRACORPOREAL SHOCK WAVE LITHOTRIPSY (ESWL);  Surgeon: Orson Ape, MD;  Location: [facility] ORS;  Service: Urology;  Laterality: Right;  . RETINAL DETACHMENT SURGERY Left     Prior to Admission medications   Medication Sig Start Date End Date Taking? Authorizing Provider  atorvastatin (LIPITOR) 40 MG tablet Take 40 mg by mouth daily.   Yes [provider]  glipiZIDE (GLUCOTROL XL) 10 MG 24 hr tablet 20 mg.  06/27/16  Yes [provider]  metFORMIN (GLUCOPHAGE-XR) 500 MG 24 hr tablet Take 1 tablet by mouth daily.  05/27/16 12/15/19 Yes [provider]  Nutritional Supplements (THERALITH XR PO) Take by mouth daily.   Yes [provider]    Allergies as of 11/04/2019  . (No Known Allergies)    Family History  Problem Relation Age of Onset  . COPD Mother   . Diabetes Father   . Diabetes Paternal Grandmother   . Diabetes Paternal Grandfather     Social History   Socioeconomic History  . Marital status: Divorced    Spouse name: Not on file  . Number of children: Not on file  .  Years of education: Not on file  . Highest education level: Not on file  Occupational History  . Not on file  Tobacco Use  . Smoking status: Never Smoker  . Smokeless tobacco: Never Used  Vaping Use  . Vaping Use: Never used  Substance and Sexual Activity  . Alcohol use: No  . Drug use: No  . Sexual activity: Not on file  Other Topics Concern  . Not on file  Social History Narrative  . Not on file   Social Determinants of Health   Financial Resource Strain:   . Difficulty of Paying Living Expenses: Not on file  Food Insecurity:   . Worried About Programme researcher, broadcasting/film/video in the Last Year: Not on file  . Ran Out of Food in the Last Year: Not on file  Transportation Needs:   . Lack of Transportation (Medical): Not on file  . Lack of Transportation (Non-Medical): Not on file  Physical Activity:   . Days of Exercise per Week: Not on file  . Minutes of Exercise per Session: Not on file  Stress:   . Feeling of Stress : Not on file  Social Connections:   . Frequency of Communication with Friends and Family: Not on file  . Frequency of Social Gatherings with Friends and Family: Not on file  . Attends Religious Services: Not on file  . Active Member of Clubs or Organizations: Not on file  . Attends Banker  Meetings: Not on file  . Marital Status: Not on file  Intimate Partner Violence:   . Fear of Current or Ex-Partner: Not on file  . Emotionally Abused: Not on file  . Physically Abused: Not on file  . Sexually Abused: Not on file    Review of Systems: See HPI, otherwise negative ROS  Physical Exam: BP (!) 140/98   Pulse 74   Temp 97.8 F (36.6 C) (Temporal)   Resp 16   Ht 6' (1.829 m)   Wt 118.8 kg   SpO2 97%   BMI 35.53 kg/m  General:   Alert,  pleasant and cooperative in NAD Head:  Normocephalic and atraumatic. Respiratory:  Normal work of breathing. Heart:  Regular rate and rhythm.   Impression/Plan: Bradley Gomez is here for cataract  surgery.  Risks, benefits, limitations, and alternatives regarding cataract surgery have been reviewed with the patient.  Questions have been answered.  All parties agreeable.   Galen Manila, MD  12/21/2019, 12:33 PM

## 2019-12-21 NOTE — Anesthesia Postprocedure Evaluation (Signed)
Anesthesia Post Note  Patient: Bradley Gomez  Procedure(s) Performed: CATARACT EXTRACTION PHACO AND INTRAOCULAR LENS PLACEMENT (IOC) LEFT 9.66 00:55.6 (Left Eye)     Patient location during evaluation: PACU Anesthesia Type: MAC Level of consciousness: awake and alert Pain management: pain level controlled Vital Signs Assessment: post-procedure vital signs reviewed and stable Respiratory status: spontaneous breathing, nonlabored ventilation, respiratory function stable and patient connected to nasal cannula oxygen Cardiovascular status: stable and blood pressure returned to baseline Postop Assessment: no apparent nausea or vomiting Anesthetic complications: no   No complications documented.  Jessamine Barcia A  Ilyse Tremain

## 2019-12-21 NOTE — Transfer of Care (Signed)
Immediate Anesthesia Transfer of Care Note  Patient: Bradley Gomez  Procedure(s) Performed: CATARACT EXTRACTION PHACO AND INTRAOCULAR LENS PLACEMENT (IOC) LEFT 9.66 00:55.6 (Left Eye)  Patient Location: PACU  Anesthesia Type: MAC  Level of Consciousness: awake, alert  and patient cooperative  Airway and Oxygen Therapy: Patient Spontanous Breathing and Patient connected to supplemental oxygen  Post-op Assessment: Post-op Vital signs reviewed, Patient's Cardiovascular Status Stable, Respiratory Function Stable, Patent Airway and No signs of Nausea or vomiting  Post-op Vital Signs: Reviewed and stable  Complications: No complications documented.

## 2019-12-21 NOTE — Anesthesia Preprocedure Evaluation (Signed)
Anesthesia Evaluation  Patient identified by MRN, date of birth, ID band Patient awake    Reviewed: Allergy & Precautions, NPO status , Patient's Chart, lab work & pertinent test results, reviewed documented beta blocker date and time   History of Anesthesia Complications Negative for: history of anesthetic complications  Airway Mallampati: III  TM Distance: >3 FB Neck ROM: Full    Dental   Pulmonary    breath sounds clear to auscultation       Cardiovascular (-) angina(-) DOE  Rhythm:Regular Rate:Normal   HLD   Neuro/Psych    GI/Hepatic neg GERD  ,  Endo/Other  diabetes  Renal/GU Renal disease (Stones)     Musculoskeletal   Abdominal (+) + obese (BMI 36),   Peds  Hematology   Anesthesia Other Findings   Reproductive/Obstetrics                            Anesthesia Physical Anesthesia Plan  ASA: II  Anesthesia Plan: MAC   Post-op Pain Management:    Induction: Intravenous  PONV Risk Score and Plan: 1 and TIVA, Midazolam and Treatment may vary due to age or medical condition  Airway Management Planned: Nasal Cannula  Additional Equipment:   Intra-op Plan:   Post-operative Plan:   Informed Consent: I have reviewed the patients History and Physical, chart, labs and discussed the procedure including the risks, benefits and alternatives for the proposed anesthesia with the patient or authorized representative who has indicated his/her understanding and acceptance.       Plan Discussed with: CRNA and Anesthesiologist  Anesthesia Plan Comments:         Anesthesia Quick Evaluation

## 2019-12-21 NOTE — Anesthesia Procedure Notes (Signed)
Procedure Name: MAC Performed by: Delainy Mcelhiney, CRNA Pre-anesthesia Checklist: Patient identified, Emergency Drugs available, Suction available, Timeout performed and Patient being monitored Patient Re-evaluated:Patient Re-evaluated prior to induction Oxygen Delivery Method: Nasal cannula Placement Confirmation: positive ETCO2       

## 2020-06-22 ENCOUNTER — Other Ambulatory Visit: Payer: Self-pay | Admitting: Family Medicine

## 2020-06-22 DIAGNOSIS — R3129 Other microscopic hematuria: Secondary | ICD-10-CM

## 2020-06-23 ENCOUNTER — Ambulatory Visit: Payer: BC Managed Care – PPO

## 2020-07-26 ENCOUNTER — Other Ambulatory Visit: Payer: Self-pay

## 2020-07-26 ENCOUNTER — Ambulatory Visit
Admission: RE | Admit: 2020-07-26 | Discharge: 2020-07-26 | Disposition: A | Discharge status: Home / Self Care | Payer: BC Managed Care – PPO | Source: Ambulatory Visit | Attending: Family Medicine | Admitting: Family Medicine

## 2020-07-26 DIAGNOSIS — R3129 Other microscopic hematuria: Secondary | ICD-10-CM | POA: Diagnosis not present

## 2020-10-12 ENCOUNTER — Ambulatory Visit
Admission: RE | Admit: 2020-10-12 | Discharge: 2020-10-12 | Disposition: A | Discharge status: Home / Self Care | Payer: BC Managed Care – PPO | Attending: Urology | Admitting: Urology

## 2020-10-12 ENCOUNTER — Other Ambulatory Visit: Payer: Self-pay

## 2020-10-12 ENCOUNTER — Encounter: Payer: Self-pay | Admitting: Urology

## 2020-10-12 ENCOUNTER — Encounter
Admission: RE | Disposition: A | Discharge status: Home / Self Care | Payer: Self-pay | Source: Home / Self Care | Attending: Urology

## 2020-10-12 DIAGNOSIS — I10 Essential (primary) hypertension: Secondary | ICD-10-CM | POA: Diagnosis not present

## 2020-10-12 DIAGNOSIS — E669 Obesity, unspecified: Secondary | ICD-10-CM | POA: Diagnosis not present

## 2020-10-12 DIAGNOSIS — N201 Calculus of ureter: Secondary | ICD-10-CM | POA: Insufficient documentation

## 2020-10-12 DIAGNOSIS — Z6834 Body mass index (BMI) 34.0-34.9, adult: Secondary | ICD-10-CM | POA: Insufficient documentation

## 2020-10-12 HISTORY — PX: EXTRACORPOREAL SHOCK WAVE LITHOTRIPSY: SHX1557

## 2020-10-12 LAB — GLUCOSE, CAPILLARY: Glucose-Capillary: 137 mg/dL — ABNORMAL HIGH (ref 70–99)

## 2020-10-12 SURGERY — LITHOTRIPSY, ESWL
Anesthesia: Moderate Sedation | Laterality: Right

## 2020-10-12 MED ORDER — ONDANSETRON 8 MG PO TBDP: 8.0000 mg | ORAL_TABLET | Freq: Four times a day (QID) | ORAL | 3 refills | Status: AC | PRN

## 2020-10-12 MED ORDER — MIDAZOLAM HCL 2 MG/2ML IJ SOLN
INTRAMUSCULAR | Status: AC
  Administered 2020-10-12: 1 mg via INTRAMUSCULAR
  Filled 2020-10-12: qty 2

## 2020-10-12 MED ORDER — MORPHINE SULFATE (PF) 10 MG/ML IV SOLN
INTRAVENOUS | Status: AC
  Administered 2020-10-12: 10 mg
  Filled 2020-10-12: qty 1

## 2020-10-12 MED ORDER — MORPHINE SULFATE (PF) 10 MG/ML IV SOLN: 10.0000 mg | Freq: Once | INTRAVENOUS | Status: AC

## 2020-10-12 MED ORDER — LEVOFLOXACIN 500 MG PO TABS: 500.0000 mg | ORAL_TABLET | Freq: Once | ORAL | Status: AC

## 2020-10-12 MED ORDER — HYDROCODONE-ACETAMINOPHEN 10-325 MG PO TABS
ORAL_TABLET | ORAL | Status: AC
  Administered 2020-10-12: 1 via ORAL
  Filled 2020-10-12: qty 1

## 2020-10-12 MED ORDER — PROMETHAZINE HCL 25 MG/ML IJ SOLN
INTRAMUSCULAR | Status: AC
  Administered 2020-10-12: 25 mg via INTRAMUSCULAR
  Filled 2020-10-12: qty 1

## 2020-10-12 MED ORDER — MIDAZOLAM HCL 2 MG/2ML IJ SOLN: 1.0000 mg | Freq: Once | INTRAMUSCULAR | Status: AC

## 2020-10-12 MED ORDER — DIPHENHYDRAMINE HCL 25 MG PO CAPS: 25.0000 mg | ORAL_CAPSULE | Freq: Once | ORAL | Status: AC

## 2020-10-12 MED ORDER — TAMSULOSIN HCL 0.4 MG PO CAPS: 0.4000 mg | ORAL_CAPSULE | Freq: Every day | ORAL | 1 refills | Status: AC

## 2020-10-12 MED ORDER — DEXTROSE-NACL 5-0.45 % IV SOLN: INTRAVENOUS | Status: DC

## 2020-10-12 MED ORDER — FUROSEMIDE 10 MG/ML IJ SOLN
INTRAMUSCULAR | Status: AC
  Filled 2020-10-12: qty 2

## 2020-10-12 MED ORDER — DIPHENHYDRAMINE HCL 25 MG PO CAPS
ORAL_CAPSULE | ORAL | Status: AC
  Administered 2020-10-12: 25 mg via ORAL
  Filled 2020-10-12: qty 1

## 2020-10-12 MED ORDER — HYDROCODONE-ACETAMINOPHEN 10-325 MG PO TABS: 1.0000 | ORAL_TABLET | Freq: Once | ORAL | Status: AC

## 2020-10-12 MED ORDER — PROMETHAZINE HCL 25 MG/ML IJ SOLN: 25.0000 mg | Freq: Once | INTRAMUSCULAR | Status: AC

## 2020-10-12 MED ORDER — LEVOFLOXACIN 500 MG PO TABS
ORAL_TABLET | ORAL | Status: AC
  Administered 2020-10-12: 500 mg via ORAL
  Filled 2020-10-12: qty 1

## 2020-10-12 MED ORDER — CIPROFLOXACIN HCL 500 MG PO TABS: 500.0000 mg | ORAL_TABLET | Freq: Two times a day (BID) | ORAL | 0 refills | Status: DC

## 2020-10-12 MED ORDER — HYDROCODONE-ACETAMINOPHEN 10-325 MG PO TABS: 1.0000 | ORAL_TABLET | Freq: Four times a day (QID) | ORAL | 0 refills | Status: AC | PRN

## 2020-10-12 MED ORDER — MORPHINE SULFATE (PF) 2 MG/ML IV SOLN
10.0000 mg | Freq: Once | INTRAVENOUS | Status: DC
Start: 2020-10-12 — End: 2020-10-12
  Filled 2020-10-12: qty 5

## 2020-10-12 MED ORDER — FUROSEMIDE 10 MG/ML IJ SOLN
10.0000 mg | Freq: Once | INTRAMUSCULAR | Status: AC
  Administered 2020-10-12: 10 mg via INTRAVENOUS

## 2020-10-12 NOTE — Discharge Instructions (Signed)
Lithotripsy, Care After This sheet gives you information about how to care for yourself after your procedure. Your health care provider may also give you more specific instructions. If you have problems or questions, contact your health careprovider. What can I expect after the procedure? After the procedure, it is common to have: Some blood in your urine. This should only last for a few days. Soreness in your back, sides, or upper abdomen for a few days. Blotches or bruises on the area where the shock wave entered the skin. Pain, discomfort, or nausea when pieces (fragments) of the kidney stone move through the tube that carries urine from the kidney to the bladder (ureter). Stone fragments may pass soon after the procedure, but they may continue to pass for up to 4-8 weeks. If you have severe pain or nausea, contact your health care provider. This may be caused by a large stone that was not broken up, and this may mean that you need more treatment. Some pain or discomfort during urination. Some pain or discomfort in the lower abdomen or (in men) at the base of the penis. Follow these instructions at home: Medicines Take over-the-counter and prescription medicines only as told by your health care provider. If you were prescribed an antibiotic medicine, take it as told by your health care provider. Do not stop taking the antibiotic even if you start to feel better. Ask your health care provider if the medicine prescribed to you requires you to avoid driving or using machinery. Eating and drinking     Drink enough fluid to keep your urine pale yellow. This helps any remaining pieces of the stone to pass. It can also help prevent new stones from forming. Eat plenty of fresh fruits and vegetables. Follow instructions from your health care provider about eating or drinking restrictions. You may be instructed to: Reduce how much salt (sodium) you eat or drink. Check ingredients and nutrition facts  on packaged foods and beverages to see how much sodium they contain. Reduce how much meat you eat. Eat the recommended amount of calcium for your age and gender. Ask your health care provider how much calcium you should have. General instructions Get plenty of rest. Return to your normal activities as told by your health care provider. Ask your health care provider what activities are safe for you. Most people can resume normal activities 1-2 days after the procedure. If you were given a sedative during the procedure, it can affect you for several hours. Do not drive or operate machinery until your health care provider says that it is safe. Your health care provider may direct you to lie in a certain position (postural drainage) and tap firmly (percuss) over your kidney area to help stone fragments pass. Follow instructions as told by your health care provider. If directed, strain all urine through the strainer that was provided by your health care provider. Keep all fragments for your health care provider to see. Any stones that are found may be sent to a medical lab for examination. The stone may be as small as a grain of salt. Keep all follow-up visits as told by your health care provider. This is important. Contact a health care provider if: You have a fever or chills. You have nausea that is severe or does not go away. You have any of these urinary symptoms: Blood in your urine for longer than your health care provider told you to expect. Urine that smells bad or unusual. Feeling a   strong urge to urinate after emptying your bladder. Pain or burning with urination that does not go away. Urinating more often than usual and this does not go away. You have a stent and it comes out. Get help right away if: You have severe pain in your back, sides, or upper abdomen. You have any of these urinary symptoms: Severe pain while urinating. More blood in your urine or having blood in your urine when  you did not before. Passing blood clots in your urine. Passing only a small amount of urine or being unable to pass any urine at all. You have severe nausea that leads to persistent vomiting. You faint. Summary After this procedure, it is common to have some pain, discomfort, or nausea when pieces (fragments) of the kidney stone move through the tube that carries urine from the kidney to the bladder (ureter). If this pain or nausea is severe, however, you should contact your health care provider. Return to your normal activities as told by your health care provider. Ask your health care provider what activities are safe for you. Drink enough fluid to keep your urine pale yellow. This helps any remaining pieces of the stone to pass, and it can help prevent new stones from forming. If directed, strain your urine and keep all fragments for your health care provider to see. Fragments or stones may be as small as a grain of salt. Get help right away if you have severe pain in your back, sides, or upper abdomen, or if you have severe pain while urinating. This information is not intended to replace advice given to you by your health care provider. Make sure you discuss any questions you have with your healthcare provider. Document Revised: 10/27/2018 Document Reviewed: 10/28/2018 Elsevier Patient Education  2022 [employer]   

## 2020-10-13 ENCOUNTER — Encounter: Payer: Self-pay | Admitting: Urology

## 2021-12-23 ENCOUNTER — Ambulatory Visit
Admission: EM | Admit: 2021-12-23 | Discharge: 2021-12-23 | Disposition: A | Discharge status: Home / Self Care | Payer: BC Managed Care – PPO | Attending: Family Medicine | Admitting: Family Medicine

## 2021-12-23 ENCOUNTER — Encounter: Payer: Self-pay | Admitting: Emergency Medicine

## 2021-12-23 DIAGNOSIS — Z1152 Encounter for screening for COVID-19: Secondary | ICD-10-CM | POA: Diagnosis not present

## 2021-12-23 DIAGNOSIS — Z79899 Other long term (current) drug therapy: Secondary | ICD-10-CM | POA: Diagnosis not present

## 2021-12-23 DIAGNOSIS — J111 Influenza due to unidentified influenza virus with other respiratory manifestations: Secondary | ICD-10-CM | POA: Insufficient documentation

## 2021-12-23 LAB — RESP PANEL BY RT-PCR (FLU A&B, COVID) ARPGX2
Influenza A by PCR: POSITIVE — AB
Influenza B by PCR: NEGATIVE
SARS Coronavirus 2 by RT PCR: NEGATIVE

## 2021-12-23 MED ORDER — DICLOFENAC SODIUM 75 MG PO TBEC: 75.0000 mg | DELAYED_RELEASE_TABLET | Freq: Two times a day (BID) | ORAL | 0 refills | Status: AC | PRN

## 2021-12-23 MED ORDER — OSELTAMIVIR PHOSPHATE 75 MG PO CAPS: 75.0000 mg | ORAL_CAPSULE | Freq: Two times a day (BID) | ORAL | 0 refills | Status: AC

## 2021-12-23 MED ORDER — PROMETHAZINE-DM 6.25-15 MG/5ML PO SYRP: 5.0000 mL | ORAL_SOLUTION | Freq: Four times a day (QID) | ORAL | 0 refills | Status: AC | PRN

## 2021-12-23 NOTE — ED Triage Notes (Signed)
Patient c/o runny nose, cough, congestion, chills, and headache that started on Thursday.  Patient reports fevers.

## 2021-12-23 NOTE — ED Provider Notes (Signed)
[facility]    CSN: [id number] Arrival date & time: 12/23/21  1154      History   Chief Complaint Chief Complaint  Patient presents with   Headache   Cough    HPI  61 year old male presents with the above complaints.  Patient reports that he has been sick since Thursday.  Reports nausea, vomiting, cough, headache, fever, chills.  He feels very poorly.  He states that the headache is quite severe.  No relieving factors.  No other complaints at this time.  Past Medical History:  Diagnosis Date   Diabetes mellitus without complication ([place])    History of kidney stones    Hyperlipidemia    Kidney stones     Patient Active Problem List   Diagnosis Date Noted   Ureterolithiasis 10/12/2020    Past Surgical History:  Procedure Laterality Date   CATARACT EXTRACTION W/PHACO Left 12/21/2019   Procedure: CATARACT EXTRACTION PHACO AND INTRAOCULAR LENS PLACEMENT (IOC) LEFT 9.66 00:55.6;  Surgeon: Birder Robson, MD;  Location: [facility];  Service: Ophthalmology;  Laterality: Left;  Diabetic - oral meds   CYSTOSCOPY WITH STENT PLACEMENT Left 06/30/2014   Procedure: CYSTOSCOPY WITH STENT PLACEMENT;  Surgeon: Royston Cowper, MD;  Location: [facility];  Service: Urology;  Laterality: Left;   EXTRACORPOREAL SHOCK WAVE LITHOTRIPSY Left 07/07/2014   Procedure: EXTRACORPOREAL SHOCK WAVE LITHOTRIPSY (ESWL);  Surgeon: Royston Cowper, MD;  Location: [facility];  Service: Urology;  Laterality: Left;   EXTRACORPOREAL SHOCK WAVE LITHOTRIPSY Right 06/05/2017   Procedure: EXTRACORPOREAL SHOCK WAVE LITHOTRIPSY (ESWL);  Surgeon: Royston Cowper, MD;  Location: [facility];  Service: Urology;  Laterality: Right;   EXTRACORPOREAL SHOCK WAVE LITHOTRIPSY Right 10/12/2020   Procedure: EXTRACORPOREAL SHOCK WAVE LITHOTRIPSY (ESWL);  Surgeon: Royston Cowper, MD;  Location: [facility];  Service: Urology;  Laterality: Right;   RETINAL DETACHMENT SURGERY Left        Home Medications     Prior to Admission medications   Medication Sig Start Date End Date Taking? Authorizing Provider  atorvastatin (LIPITOR) 40 MG tablet Take 40 mg by mouth daily.   Yes [provider]  diclofenac (VOLTAREN) 75 MG EC tablet Take 1 tablet (75 mg total) by mouth 2 (two) times daily as needed for moderate pain (Headache). 12/23/21  Yes Strider, Jeraldin Fesler G, DO  glipiZIDE (GLUCOTROL XL) 10 MG 24 hr tablet 20 mg.  06/27/16  Yes [provider]  metFORMIN (GLUCOPHAGE-XR) 500 MG 24 hr tablet Take 1 tablet by mouth 2 (two) times daily with a meal. 05/27/16 12/23/21 Yes [provider]  oseltamivir (TAMIFLU) 75 MG capsule Take 1 capsule (75 mg total) by mouth every 12 (twelve) hours. 12/23/21  Yes Ringgold, Travante Knee G, DO  promethazine-dextromethorphan (PROMETHAZINE-DM) 6.25-15 MG/5ML syrup Take 5 mLs by mouth 4 (four) times daily as needed for cough. 12/23/21  Yes Lamos, Daymon Hora G, DO  HYDROcodone-acetaminophen (NORCO) 10-325 MG tablet Take 1 tablet by mouth every 6 (six) hours as needed for moderate pain. Maximum dose per 24 hours -8 pills. 10/12/20   Royston Cowper, MD  Nutritional Supplements (THERALITH XR PO) Take by mouth daily.    [provider]  ondansetron (ZOFRAN ODT) 8 MG disintegrating tablet Take 1 tablet (8 mg total) by mouth every 6 (six) hours as needed for nausea or vomiting. 10/12/20   Royston Cowper, MD  tamsulosin (FLOMAX) 0.4 MG CAPS capsule Take 1 capsule (0.4 mg total) by mouth daily. 10/12/20   Yves Dill,  Suszanne Conners, MD    Family History Family History  Problem Relation Age of Onset   COPD Mother    Diabetes Father    Diabetes Paternal Grandmother    Diabetes Paternal Grandfather     Social History Social History   Tobacco Use   Smoking status: Never   Smokeless tobacco: Never  Vaping Use   Vaping Use: Never used  Substance Use Topics   Alcohol use: No   Drug use: No     Allergies   Patient has no known allergies.   Review of Systems Review of  Systems  Constitutional:  Positive for chills and fever.  HENT:  Positive for congestion.   Respiratory:  Positive for cough.   Gastrointestinal:  Positive for nausea and vomiting.  Neurological:  Positive for headaches.   Physical Exam Triage Vital Signs ED Triage Vitals  Enc Vitals Group     BP 12/23/21 1209 (!) 149/99     Pulse Rate 12/23/21 1209 74     Resp 12/23/21 1209 15     Temp 12/23/21 1209 99 F (37.2 C)     Temp Source 12/23/21 1209 Oral     SpO2 12/23/21 1209 95 %     Weight 12/23/21 1208 260 lb (117.9 kg)     Height 12/23/21 1208 6' (1.829 m)     Head Circumference --      Peak Flow --      Pain Score 12/23/21 1208 10     Pain Loc --      Pain Edu? --      Excl. in GC? --    Updated Vital Signs BP (!) 149/99 (BP Location: Right Arm)   Pulse 74   Temp 99 F (37.2 C) (Oral)   Resp 15   Ht 6' (1.829 m)   Wt 117.9 kg   SpO2 95%   BMI 35.26 kg/m   Visual Acuity Right Eye Distance:   Left Eye Distance:   Bilateral Distance:    Right Eye Near:   Left Eye Near:    Bilateral Near:     Physical Exam Vitals and nursing note reviewed.  Constitutional:      Comments: Appears mildly ill.  HENT:     Head: Normocephalic and atraumatic.  Eyes:     General:        Right eye: No discharge.        Left eye: No discharge.     Conjunctiva/sclera: Conjunctivae normal.  Cardiovascular:     Rate and Rhythm: Normal rate and regular rhythm.  Pulmonary:     Effort: Pulmonary effort is normal.     Breath sounds: Normal breath sounds. No wheezing, rhonchi or rales.  Neurological:     Mental Status: He is alert.  Psychiatric:        Mood and Affect: Mood normal.        Behavior: Behavior normal.      UC Treatments / Results  Labs (all labs ordered are listed, but only abnormal results are displayed) Labs Reviewed  RESP PANEL BY RT-PCR (FLU A&B, COVID) ARPGX2 - Abnormal; Notable for the following components:      Result Value   Influenza A by PCR POSITIVE  (*)    All other components within normal limits    EKG   Radiology No results found.  Procedures Procedures (including critical care time)  Medications Ordered in UC Medications - No data to display  Initial Impression / Assessment and  Plan / UC Course  I have reviewed the triage vital signs and the nursing notes.  Pertinent labs & imaging results that were available during my care of the patient were reviewed by me and considered in my medical decision making (see chart for details).    61 year old male presents with influenza.  Acute illness with systemic symptoms given ongoing fever.  Treating with Tamiflu, Promethazine DM.  Diclofenac as needed for headache.  Final Clinical Impressions(s) / UC Diagnoses   Final diagnoses:  Influenza   Discharge Instructions   None    ED Prescriptions     Medication Sig Dispense Auth. Provider   oseltamivir (TAMIFLU) 75 MG capsule Take 1 capsule (75 mg total) by mouth every 12 (twelve) hours. 10 capsule Lacinda Axon, Kyleigh Nannini G, DO   diclofenac (VOLTAREN) 75 MG EC tablet Take 1 tablet (75 mg total) by mouth 2 (two) times daily as needed for moderate pain (Headache). 30 tablet Crigler, Afton Mikelson G, DO   promethazine-dextromethorphan (PROMETHAZINE-DM) 6.25-15 MG/5ML syrup Take 5 mLs by mouth 4 (four) times daily as needed for cough. 118 mL Coral Spikes, DO      PDMP not reviewed this encounter.   Clara, [place], [place] 12/23/21 1328

## 2022-03-12 IMAGING — CT CT ABD-PELV W/O CM
2 of 4 series · 16 of 46 positions shown, 18 images · non-contrast
Comparison: CT of the abdomen pelvis dated 05/21/2017.

CLINICAL DATA: 59-year-old male with hematuria. Concern for kidney
stone.

EXAM:
CT ABDOMEN AND PELVIS WITHOUT CONTRAST
TECHNIQUE: Multidetector CT imaging of the abdomen and pelvis was performed
following the standard protocol without IV contrast.

[Series 2: routine abd/pel wo · axial · 0.90mm/px · z∈[-357,+148]mm · 13 of 111 slices shown, 15 images]
[im 5/111  soft-tissue]
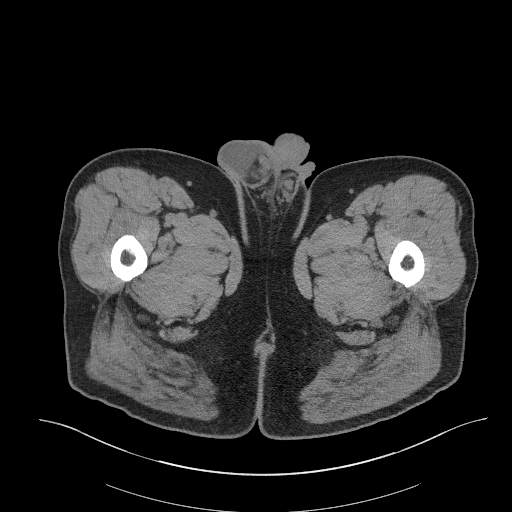
[im 5/111  bone]
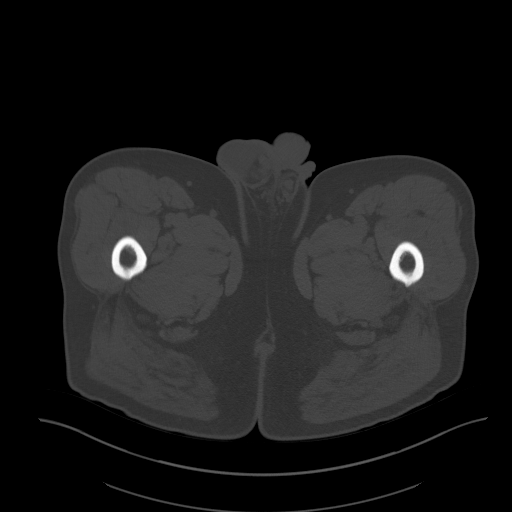
[im 15/111  soft-tissue]
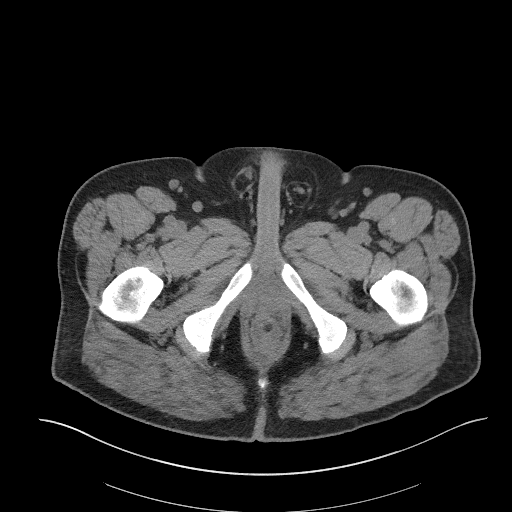
[im 24/111  soft-tissue]
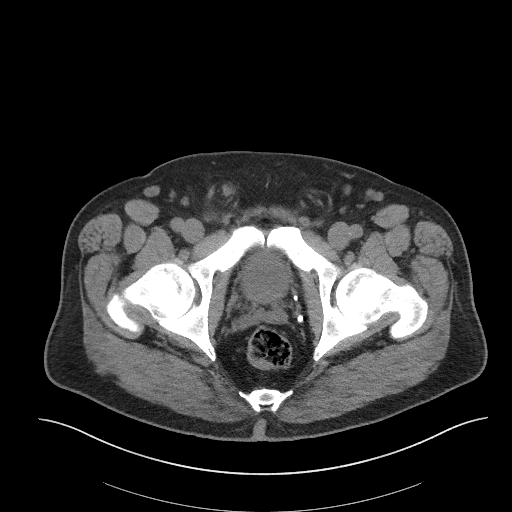
[im 29/111  soft-tissue]
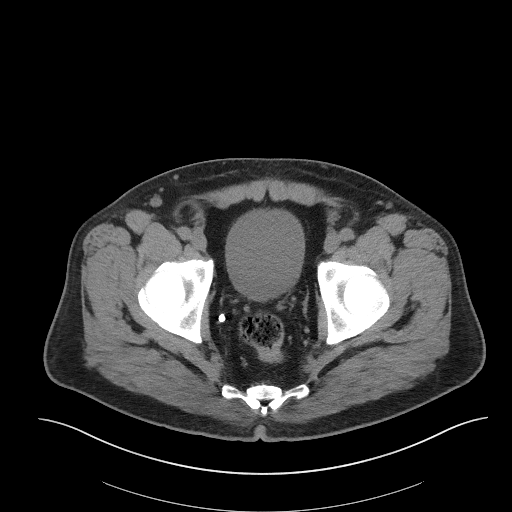
[im 39/111  soft-tissue]
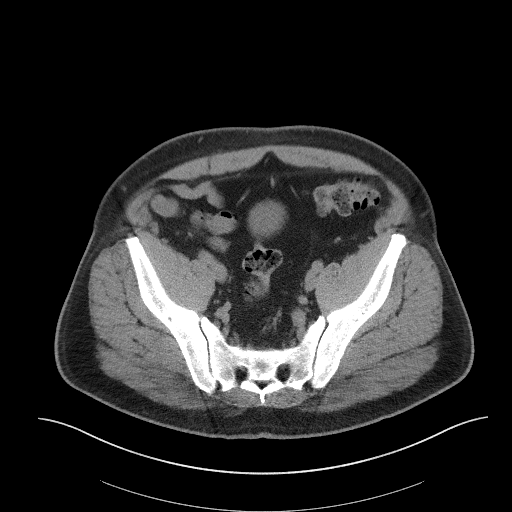
[im 48/111  soft-tissue]
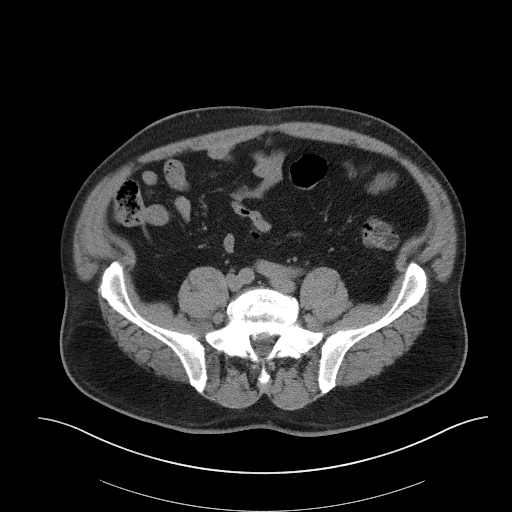
[im 58/111  soft-tissue]
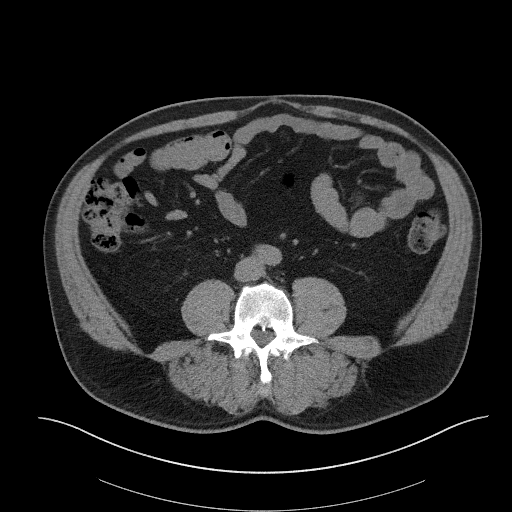
[im 63/111  soft-tissue]
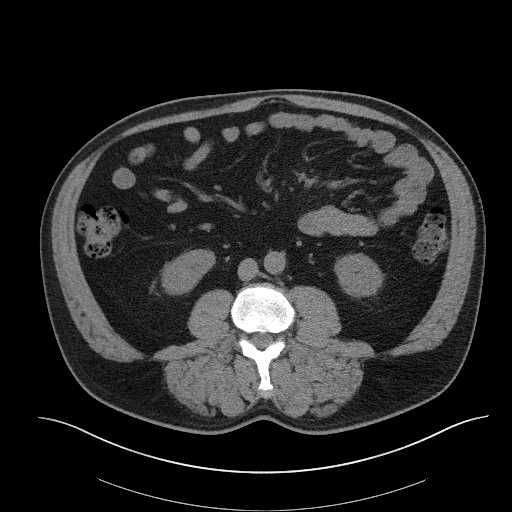
[im 72/111  soft-tissue]
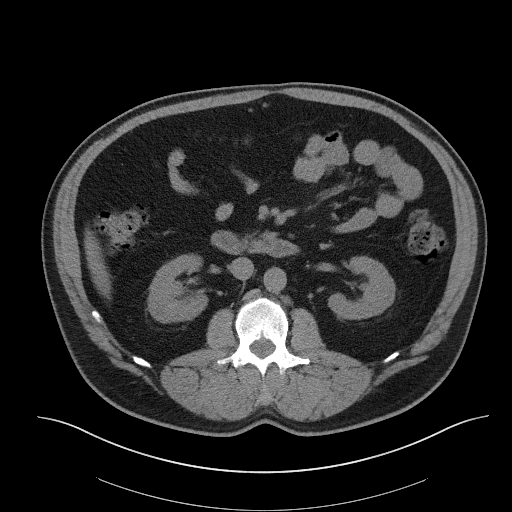
[im 72/111  bone]
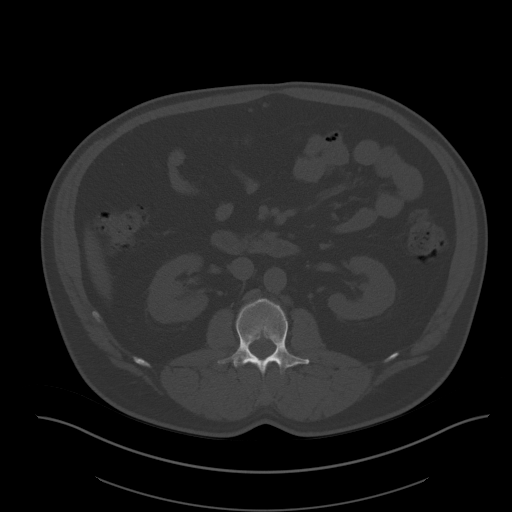
[im 82/111  soft-tissue]
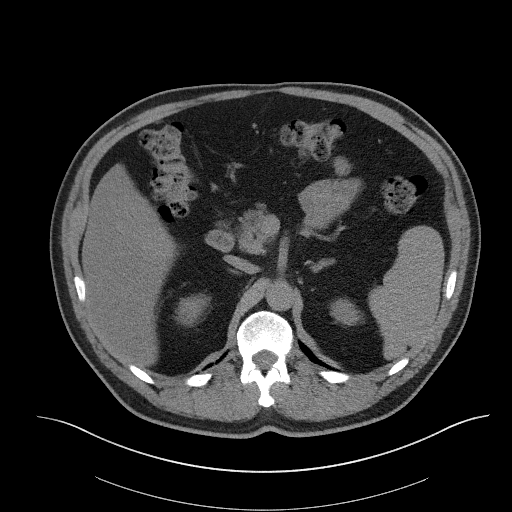
[im 87/111  soft-tissue]
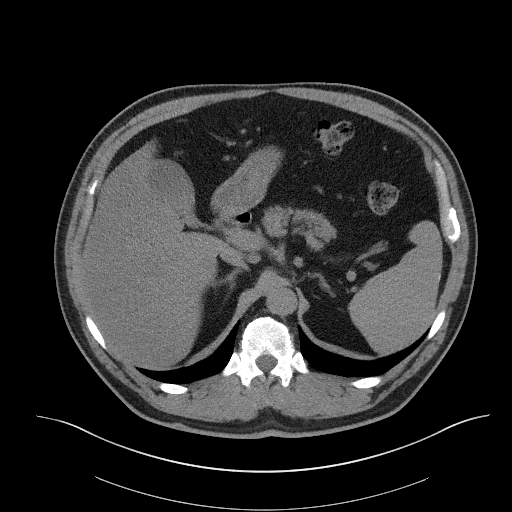
[im 96/111  soft-tissue]
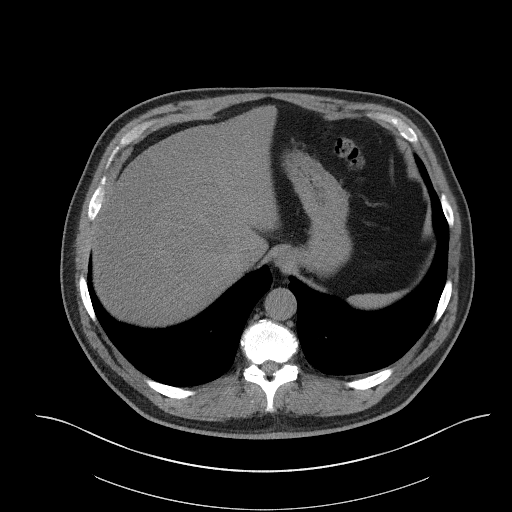
[im 106/111  soft-tissue]
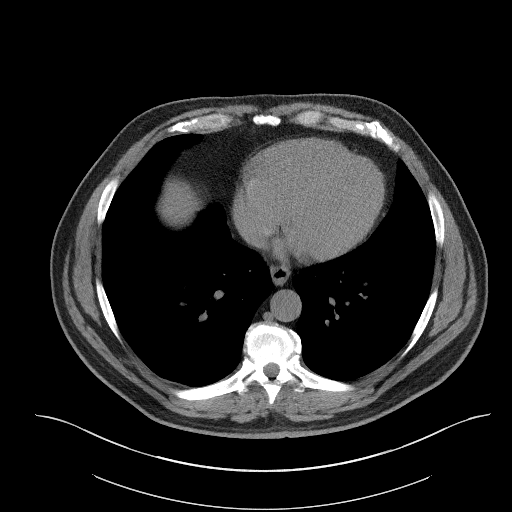

[Series 5: coronal st · coronal · 0.81mm/px · 3 of 114 slices shown]
[im 38/114  soft-tissue]
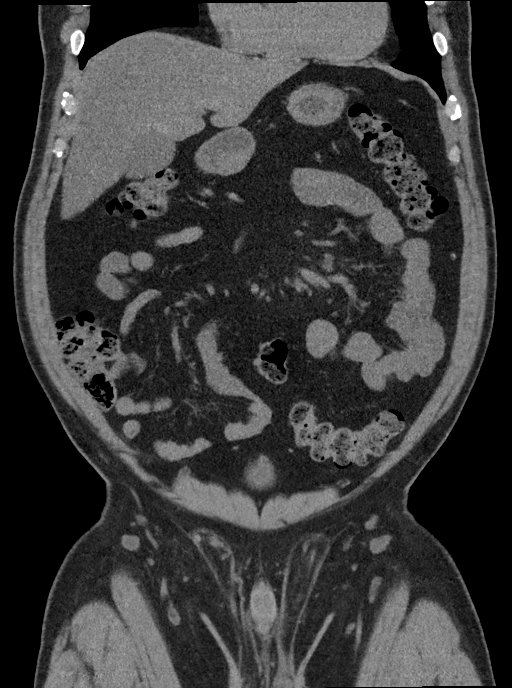
[im 51/114  soft-tissue]
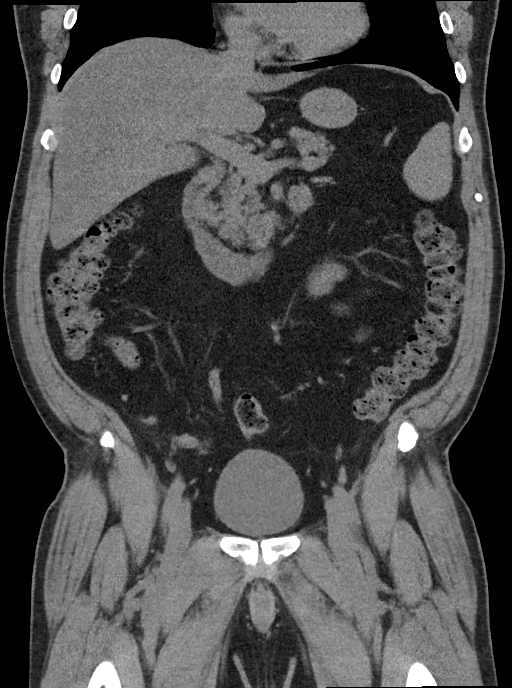
[im 63/114  soft-tissue]
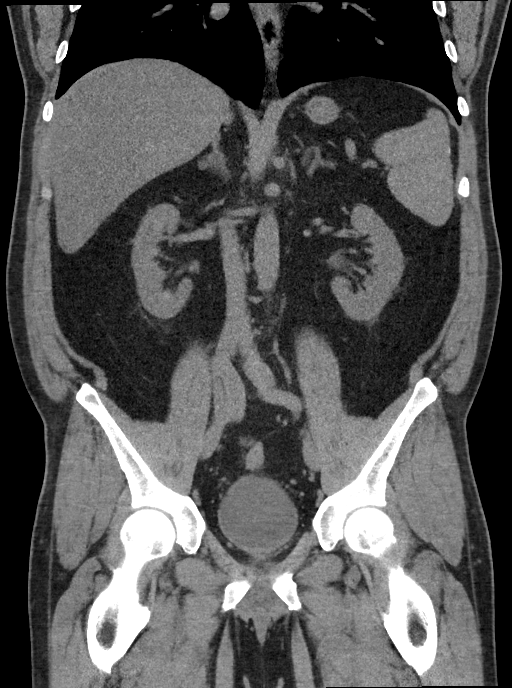

[16 of 46 positions shown; findings below may reference images not displayed]

FINDINGS: Evaluation of this exam is limited in the absence of intravenous
contrast.

Lower chest: The visualized lung bases are clear.

No intra-abdominal free air or free fluid.

Hepatobiliary: Fatty liver. No intrahepatic biliary ductal
dilatation. The gallbladder is unremarkable.

Pancreas: Unremarkable. No pancreatic ductal dilatation or
surrounding inflammatory changes.

Spleen: Normal in size without focal abnormality.

Adrenals/Urinary Tract: The adrenal glands are unremarkable. There
is a 14 mm right renal inferior pole nonobstructing calculus. No
hydronephrosis. The left kidney is unremarkable. The visualized
ureters and urinary bladder appear unremarkable.

Stomach/Bowel: There is no bowel obstruction or active inflammation.
The appendix is normal.

Vascular/Lymphatic: The abdominal aorta and IVC are unremarkable. No
portal venous gas. There is no adenopathy.

Reproductive: The prostate and seminal vesicles are grossly
unremarkable. No pelvic mass.

Other: Partially visualized right hydrocele.

Musculoskeletal: Degenerative changes of the spine. No acute osseous
pathology.
IMPRESSION: 1. A 14 mm right renal inferior pole nonobstructing calculus. No
hydronephrosis.
2. Fatty liver.
3. No bowel obstruction. Normal appendix.
